# Patient Record
Sex: Female | Born: 1995 | Race: Black or African American | Hispanic: No | Marital: Single | Smoking: Never smoker
Health system: Southern US, Community
[De-identification: ages and names within clinical notes are randomized; demographics above are authoritative.]

## PROBLEM LIST (undated history)

## (undated) DIAGNOSIS — R011 Cardiac murmur, unspecified: Secondary | ICD-10-CM

## (undated) DIAGNOSIS — L309 Dermatitis, unspecified: Secondary | ICD-10-CM

## (undated) HISTORY — PX: MOUTH SURGERY: SHX715

## (undated) HISTORY — PX: KNEE SURGERY: SHX244

## (undated) HISTORY — DX: Dermatitis, unspecified: L30.9

---
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
## 2014-03-23 ENCOUNTER — Encounter (HOSPITAL_COMMUNITY): Payer: Self-pay | Admitting: Emergency Medicine

## 2014-03-23 ENCOUNTER — Emergency Department (HOSPITAL_COMMUNITY): Payer: Federal, State, Local not specified - PPO

## 2014-03-23 ENCOUNTER — Emergency Department (HOSPITAL_COMMUNITY)
Admission: EM | Admit: 2014-03-23 | Discharge: 2014-03-23 | Disposition: A | Discharge status: Home / Self Care | Payer: Federal, State, Local not specified - PPO | Attending: Emergency Medicine | Admitting: Emergency Medicine

## 2014-03-23 DIAGNOSIS — Y9289 Other specified places as the place of occurrence of the external cause: Secondary | ICD-10-CM | POA: Insufficient documentation

## 2014-03-23 DIAGNOSIS — Y9301 Activity, walking, marching and hiking: Secondary | ICD-10-CM | POA: Insufficient documentation

## 2014-03-23 DIAGNOSIS — Z872 Personal history of diseases of the skin and subcutaneous tissue: Secondary | ICD-10-CM | POA: Diagnosis not present

## 2014-03-23 DIAGNOSIS — W010XXA Fall on same level from slipping, tripping and stumbling without subsequent striking against object, initial encounter: Secondary | ICD-10-CM | POA: Insufficient documentation

## 2014-03-23 DIAGNOSIS — S99919A Unspecified injury of unspecified ankle, initial encounter: Secondary | ICD-10-CM

## 2014-03-23 DIAGNOSIS — S90129A Contusion of unspecified lesser toe(s) without damage to nail, initial encounter: Secondary | ICD-10-CM | POA: Insufficient documentation

## 2014-03-23 DIAGNOSIS — R011 Cardiac murmur, unspecified: Secondary | ICD-10-CM | POA: Diagnosis not present

## 2014-03-23 DIAGNOSIS — S8990XA Unspecified injury of unspecified lower leg, initial encounter: Secondary | ICD-10-CM | POA: Diagnosis present

## 2014-03-23 DIAGNOSIS — S99929A Unspecified injury of unspecified foot, initial encounter: Secondary | ICD-10-CM

## 2014-03-23 DIAGNOSIS — S90122A Contusion of left lesser toe(s) without damage to nail, initial encounter: Secondary | ICD-10-CM

## 2014-03-23 NOTE — Discharge Instructions (Signed)
Ibuprofen 600 mg every 6 hours as needed for pain.  Follow up with your primary Dr. if not improving in the next 1-2 weeks.   Contusion A contusion is a deep bruise. Contusions are the result of an injury that caused bleeding under the skin. The contusion may turn blue, purple, or yellow. Minor injuries will give you a painless contusion, but more severe contusions may stay painful and swollen for a few weeks.  CAUSES  A contusion is usually caused by a blow, trauma, or direct force to an area of the body. SYMPTOMS   Swelling and redness of the injured area.  Bruising of the injured area.  Tenderness and soreness of the injured area.  Pain. DIAGNOSIS  The diagnosis can be made by taking a history and physical exam. An X-ray, CT scan, or MRI may be needed to determine if there were any associated injuries, such as fractures. TREATMENT  Specific treatment will depend on what area of the body was injured. In general, the best treatment for a contusion is resting, icing, elevating, and applying cold compresses to the injured area. Over-the-counter medicines may also be recommended for pain control. Ask your caregiver what the best treatment is for your contusion. HOME CARE INSTRUCTIONS   Put ice on the injured area.  Put ice in a plastic bag.  Place a towel between your skin and the bag.  Leave the ice on for 15-20 minutes, 3-4 times a day, or as directed by your health care provider.  Only take over-the-counter or prescription medicines for pain, discomfort, or fever as directed by your caregiver. Your caregiver may recommend avoiding anti-inflammatory medicines (aspirin, ibuprofen, and naproxen) for 48 hours because these medicines may increase bruising.  Rest the injured area.  If possible, elevate the injured area to reduce swelling. SEEK IMMEDIATE MEDICAL CARE IF:   You have increased bruising or swelling.  You have pain that is getting worse.  Your swelling or pain is not  relieved with medicines. MAKE SURE YOU:   Understand these instructions.  Will watch your condition.  Will get help right away if you are not doing well or get worse. Document Released: 03/30/2005 Document Revised: 06/25/2013 Document Reviewed: 04/25/2011 [facility] Patient Information 2015 [place], [place]. This information is not intended to replace advice given to you by your health care provider. Make sure you discuss any questions you have with your health care provider.

## 2014-03-23 NOTE — ED Notes (Signed)
Reports foot went numb, denies pain "unless you mess with it", admits TTP L 5th little toe. Denies other injuries. Was wearing sandals. Also skinned elbow and knee.

## 2014-03-23 NOTE — ED Provider Notes (Signed)
CSN: [id number]     Arrival date & time 03/23/14  0135 History   First MD Initiated Contact with Patient 03/23/14 0530     Chief Complaint  Patient presents with  . Toe Injury     (Consider location/radiation/quality/duration/timing/severity/associated sxs/prior Treatment) HPI Comments: Patient presents with complaints of left fifth toe swelling and pain for the past 2 days. She states that she was walking and tripped and fell and injured it. She's been having difficulty bearing weight and walking due to the discomfort. The pain is relieved with rest.  The history is provided by the patient.    Past Medical History  Diagnosis Date  . Murmur, cardiac   . Eczema    Past Surgical History  Procedure Laterality Date  . Knee surgery    . Knee surgery    . Mouth surgery     Family History  Problem Relation Age of Onset  . Hypertension Father    History  Substance Use Topics  . Smoking status: Never Smoker   . Smokeless tobacco: Not on file  . Alcohol Use: No   OB History   Grav Para Term Preterm Abortions TAB SAB Ect Mult Living                 Review of Systems  All other systems reviewed and are negative.     Allergies  Citric acid  Home Medications   Prior to Admission medications   Medication Sig Start Date End Date Taking? Authorizing Provider  etonogestrel (NEXPLANON) 68 MG IMPL implant Inject 1 each into the skin once.   Yes Historical Provider, MD   BP 117/65  Pulse 102  Temp(Src) 98.1 F (36.7 C) (Oral)  Resp 20  SpO2 98% Physical Exam  Nursing note and vitals reviewed. Constitutional: She is oriented to person, place, and time. She appears well-developed and well-nourished. No distress.  HENT:  Head: Normocephalic and atraumatic.  Neck: Normal range of motion. Neck supple.  Musculoskeletal:  The left fifth toe is noted to have an ecchymotic area on the lateral aspect. There is pain with palpation and range of motion.  Neurological: She is alert  and oriented to person, place, and time.  Skin: Skin is warm and dry. She is not diaphoretic.    ED Course  Procedures (including critical care time) Labs Review Labs Reviewed - No data to display  Imaging Review Dg Toe 5th Left  03/23/2014   CLINICAL DATA:  Left fifth toe pain and swelling. Person fell on patient's foot 2 days ago.  EXAM: DG TOE 5TH LEFT  COMPARISON:  None.  FINDINGS: The left fifth toe appears intact. Visualized joint spaces are preserved. There is no evidence of fracture or dislocation. No definite soft tissue abnormalities are characterized on radiograph.  IMPRESSION: No evidence of fracture or dislocation.   Electronically Signed   By: Roanna Raider M.D.   On: 03/23/2014 02:35     EKG Interpretation None      MDM   Final diagnoses:  None    X-rays failed to reveal a fracture. This appears to be a sprain or stove of the toe. Will recommend Motrin and time. To followup with primary Dr. if not improving in the next 1-2 weeks.    Geoffery Lyons, MD 03/23/14 [phone]

## 2014-03-23 NOTE — ED Notes (Signed)
Pt. reports injury to left 5th toe 2 days ago " twisted" while walking , presents with mild swelling and pain .

## 2014-08-08 ENCOUNTER — Encounter: Payer: Self-pay | Admitting: Obstetrics

## 2014-08-08 ENCOUNTER — Ambulatory Visit (INDEPENDENT_AMBULATORY_CARE_PROVIDER_SITE_OTHER): Admitting: Obstetrics

## 2014-08-08 VITALS — BP 109/65 | HR 77 | Temp 98.5°F | Wt 142.0 lb

## 2014-08-08 DIAGNOSIS — N760 Acute vaginitis: Secondary | ICD-10-CM

## 2014-08-08 DIAGNOSIS — A499 Bacterial infection, unspecified: Secondary | ICD-10-CM

## 2014-08-08 DIAGNOSIS — Z113 Encounter for screening for infections with a predominantly sexual mode of transmission: Secondary | ICD-10-CM

## 2014-08-08 DIAGNOSIS — B9689 Other specified bacterial agents as the cause of diseases classified elsewhere: Secondary | ICD-10-CM

## 2014-08-08 MED ORDER — METRONIDAZOLE 500 MG PO TABS: 500.0000 mg | ORAL_TABLET | Freq: Three times a day (TID) | ORAL | Status: AC

## 2014-08-08 NOTE — Progress Notes (Signed)
Patient ID: Wendy Dorsey, female   DOB: [birth year], 19 y.o.   MRN: [id number]  Chief complaint: change in vaginal discharge, desires STD testing.    HPI Wendy Dorsey is a 19 y.o. female.  Patient complains of a change in her vaginal discharge and itching that went away.  Currently sexually active, not using condoms.    HPI  Past Medical History  Diagnosis Date  . Murmur, cardiac   . Eczema     Past Surgical History  Procedure Laterality Date  . Knee surgery    . Knee surgery    . Mouth surgery      Family History  Problem Relation Age of Onset  . Hypertension Father     Social History History  Substance Use Topics  . Smoking status: Never Smoker   . Smokeless tobacco: Not on file  . Alcohol Use: No    Allergies  Allergen Reactions  . Citric Acid     rash    Current Outpatient Prescriptions  Medication Sig Dispense Refill  . etonogestrel (NEXPLANON) 68 MG IMPL implant Inject 1 each into the skin once.    . metroNIDAZOLE (FLAGYL) 500 MG tablet Take 1 tablet (500 mg total) by mouth 3 (three) times daily. 21 tablet 0   No current facility-administered medications for this visit.    Review of Systems Review of Systems Constitutional: negative for fatigue and weight loss Respiratory: negative for cough and wheezing Cardiovascular: negative for chest pain, fatigue and palpitations Gastrointestinal: negative for abdominal pain and change in bowel habits Genitourinary:negative Integument/breast: negative for nipple discharge Musculoskeletal:negative for myalgias Neurological: negative for gait problems and tremors Behavioral/Psych: negative for abusive relationship, depression Endocrine: negative for temperature intolerance     Blood pressure 109/65, pulse 77, temperature 98.5 F (36.9 C), weight 64.411 kg (142 lb).  Physical Exam Physical Exam General:   alert  Skin:   no rash or abnormalities  Abdomen:  normal findings: no organomegaly,  soft, non-tender and no hernia  Pelvis:  External genitalia: normal general appearance Urinary system: urethral meatus normal and bladder without fullness, nontender Vaginal: normal without tenderness, induration or masses. Small, amount of thin vaginal discharge noted with fishy smell. Cervix: non-tender to palpation. Adnexa: normal bimanual exam Uterus: anteverted and non-tender, normal size     Assessment    Bacterial Vaginosis     Plan    No orders of the defined types were placed in this encounter.   Meds ordered this encounter  Medications  . metroNIDAZOLE (FLAGYL) 500 MG tablet    Sig: Take 1 tablet (500 mg total) by mouth 3 (three) times daily.    Dispense:  21 tablet    Refill:  0    Sure swab obtained for culture.  Encouraged use of condoms with sexual intercourse.    Follow up as needed.

## 2014-08-08 NOTE — Addendum Note (Signed)
Addended by: Marya LandryFOSTER, Meleni Delahunt D on: 08/08/2014 02:26 PM   Modules accepted: Orders

## 2014-08-08 NOTE — Addendum Note (Signed)
Addended by: Marya LandryFOSTER, Davarius Ridener D on: 08/08/2014 02:15 PM   Modules accepted: Orders

## 2014-08-09 LAB — GC/CHLAMYDIA PROBE AMP
CT Probe RNA: NEGATIVE
GC Probe RNA: NEGATIVE

## 2014-08-12 ENCOUNTER — Other Ambulatory Visit: Payer: Self-pay | Admitting: Obstetrics

## 2014-08-12 DIAGNOSIS — B3731 Acute candidiasis of vulva and vagina: Secondary | ICD-10-CM

## 2014-08-12 DIAGNOSIS — B373 Candidiasis of vulva and vagina: Secondary | ICD-10-CM

## 2014-08-12 LAB — SURESWAB BACTERIAL VAGINOSIS/ITIS
ATOPOBIUM VAGINAE: 7.6 Log (cells/mL)
BV CATEGORY: UNDETERMINED — AB
C. ALBICANS, DNA: DETECTED — AB
C. TROPICALIS, DNA: NOT DETECTED
C. glabrata, DNA: NOT DETECTED
C. parapsilosis, DNA: DETECTED — AB
LACTOBACILLUS SPECIES: 8 Log (cells/mL)
MEGASPHAERA SPECIES: 7.8 Log (cells/mL)
T. VAGINALIS RNA, QL TMA: NOT DETECTED

## 2014-08-12 MED ORDER — FLUCONAZOLE 150 MG PO TABS: 150.0000 mg | ORAL_TABLET | Freq: Once | ORAL | Status: DC

## 2014-10-23 ENCOUNTER — Ambulatory Visit: Payer: Self-pay | Admitting: Certified Nurse Midwife

## 2014-10-24 ENCOUNTER — Encounter: Payer: Self-pay | Admitting: Certified Nurse Midwife

## 2014-10-24 ENCOUNTER — Ambulatory Visit (INDEPENDENT_AMBULATORY_CARE_PROVIDER_SITE_OTHER): Payer: Federal, State, Local not specified - PPO | Admitting: Certified Nurse Midwife

## 2014-10-24 VITALS — BP 111/78 | HR 90 | Temp 98.3°F | Ht 59.0 in | Wt 146.0 lb

## 2014-10-24 DIAGNOSIS — Z113 Encounter for screening for infections with a predominantly sexual mode of transmission: Secondary | ICD-10-CM

## 2014-10-24 DIAGNOSIS — L739 Follicular disorder, unspecified: Secondary | ICD-10-CM | POA: Diagnosis not present

## 2014-10-24 LAB — CBC WITH DIFFERENTIAL/PLATELET
BASOS PCT: 0 % (ref 0–1)
Basophils Absolute: 0 10*3/uL (ref 0.0–0.1)
EOS ABS: 0.2 10*3/uL (ref 0.0–0.7)
Eosinophils Relative: 5 % (ref 0–5)
HEMATOCRIT: 39.2 % (ref 36.0–46.0)
HEMOGLOBIN: 13.7 g/dL (ref 12.0–15.0)
Lymphocytes Relative: 49 % — ABNORMAL HIGH (ref 12–46)
Lymphs Abs: 2.2 10*3/uL (ref 0.7–4.0)
MCH: 32.4 pg (ref 26.0–34.0)
MCHC: 34.9 g/dL (ref 30.0–36.0)
MCV: 92.7 fL (ref 78.0–100.0)
MONO ABS: 0.3 10*3/uL (ref 0.1–1.0)
MONOS PCT: 6 % (ref 3–12)
MPV: 9.7 fL (ref 8.6–12.4)
NEUTROS PCT: 40 % — AB (ref 43–77)
Neutro Abs: 1.8 10*3/uL (ref 1.7–7.7)
PLATELETS: 289 10*3/uL (ref 150–400)
RBC: 4.23 MIL/uL (ref 3.87–5.11)
RDW: 13.1 % (ref 11.5–15.5)
WBC: 4.4 10*3/uL (ref 4.0–10.5)

## 2014-10-24 MED ORDER — HYDROCORTISONE 2.5 % EX CREA: TOPICAL_CREAM | Freq: Two times a day (BID) | CUTANEOUS | Status: DC

## 2014-10-24 MED ORDER — TRIPLE ANTIBIOTIC 5-400-5000 EX OINT: TOPICAL_OINTMENT | Freq: Four times a day (QID) | CUTANEOUS | Status: DC

## 2014-10-24 NOTE — Progress Notes (Signed)
Patient ID: Wendy Dorsey, female   DOB: 07-18-1995, 19 y.o.   MRN: [id number]   Chief Complaint  Patient presents with  . Problem    "Lump" in vaginal area.     HPI Wendy Dorsey is a 19 y.o. female.  C/O lump in right groin area, noticed the lump several days ago and reports pain with pressure in the area of the lump.  Denies recent illness.  Vaginal discharge gray denies odor.   Tried douching 1 week ago.  Education on vaginal hygiene given.  Reports shaving pubic hair, shaves every 2 days.  Educated on trimming pubic hair versus shaving.  Patient desires STD screening.     HPI  Past Medical History  Diagnosis Date  . Murmur, cardiac   . Eczema     Past Surgical History  Procedure Laterality Date  . Knee surgery    . Knee surgery    . Mouth surgery      Family History  Problem Relation Age of Onset  . Hypertension Father     Social History History  Substance Use Topics  . Smoking status: Never Smoker   . Smokeless tobacco: Not on file  . Alcohol Use: No    Allergies  Allergen Reactions  . Citric Acid     rash    Current Outpatient Prescriptions  Medication Sig Dispense Refill  . etonogestrel (NEXPLANON) 68 MG IMPL implant Inject 1 each into the skin once.    . hydrocortisone 2.5 % cream Apply topically 2 (two) times daily. 30 g 0  . neomycin-bacitracin-polymyxin (NEOSPORIN) [phone] ointment Apply topically 4 (four) times daily. 28.3 g 0   No current facility-administered medications for this visit.    Review of Systems Review of Systems Constitutional: negative for fatigue and weight loss Respiratory: negative for cough and wheezing Cardiovascular: negative for chest pain, fatigue and palpitations Gastrointestinal: negative for abdominal pain and change in bowel habits Genitourinary:+ lump in groin, groin irritation  Integument/breast: negative for nipple discharge Musculoskeletal:negative for myalgias Neurological: negative  for gait problems and tremors Behavioral/Psych: negative for abusive relationship, depression Endocrine: negative for temperature intolerance     Blood pressure 111/78, pulse 90, temperature 98.3 F (36.8 C), height  (1.499 m), weight 66.225 kg (146 lb).  Physical Exam Physical Exam General:   alert  Skin:   no rash or abnormalities  Lungs:   clear to auscultation bilaterally  Heart:   regular rate and rhythm, S1, S2 normal, no click, rub or gallop. +murmur grade 1-2.  Breasts:   deferred  Abdomen:  normal findings: no organomegaly, soft, non-tender and no hernia  Pelvis:  External genitalia: normal general appearance Urinary system: urethral meatus normal and bladder without fullness, nontender Vaginal: normal without tenderness, induration or masses.  Cervix: deferred Adnexa: deferred Uterus: deferred    75% of 15 min visit spent on counseling and coordination of care.   Data Reviewed Previous medical hx, labs, medicaitons  Assessment     Foliculitis  STD screen    Plan    Orders Placed This Encounter  Procedures  . SureSwab, Vaginosis/Vaginitis Plus  . HIV antibody (with reflex)  . CBC with Differential/Platelet  . Comprehensive metabolic panel  . TSH  . Hepatitis B surface antigen  . RPR  . Hepatitis C antibody   Meds ordered this encounter  Medications  . neomycin-bacitracin-polymyxin (NEOSPORIN) [phone] ointment    Sig: Apply topically 4 (four) times daily.    Dispense:  28.3 g  Refill:  0  . hydrocortisone 2.5 % cream    Sig: Apply topically 2 (two) times daily.    Dispense:  30 g    Refill:  0     Follow up as needed or with annual exam.

## 2014-10-25 LAB — HEPATITIS C ANTIBODY: HCV AB: NEGATIVE

## 2014-10-25 LAB — COMPREHENSIVE METABOLIC PANEL
ALBUMIN: 3.8 g/dL (ref 3.5–5.2)
ALK PHOS: 51 U/L (ref 39–117)
ALT: 21 U/L (ref 0–35)
AST: 19 U/L (ref 0–37)
BILIRUBIN TOTAL: 0.4 mg/dL (ref 0.2–1.1)
BUN: 10 mg/dL (ref 6–23)
CALCIUM: 8.7 mg/dL (ref 8.4–10.5)
CO2: 23 mEq/L (ref 19–32)
Chloride: 106 mEq/L (ref 96–112)
Creat: 0.73 mg/dL (ref 0.50–1.10)
Glucose, Bld: 77 mg/dL (ref 70–99)
POTASSIUM: 3.7 meq/L (ref 3.5–5.3)
SODIUM: 141 meq/L (ref 135–145)
TOTAL PROTEIN: 6 g/dL (ref 6.0–8.3)

## 2014-10-25 LAB — HIV ANTIBODY (ROUTINE TESTING W REFLEX): HIV: NONREACTIVE

## 2014-10-25 LAB — TSH: TSH: 1.373 u[IU]/mL (ref 0.350–4.500)

## 2014-10-25 LAB — RPR

## 2014-10-25 LAB — HEPATITIS B SURFACE ANTIGEN: Hepatitis B Surface Ag: NEGATIVE

## 2014-10-28 ENCOUNTER — Other Ambulatory Visit: Payer: Self-pay | Admitting: Certified Nurse Midwife

## 2014-10-28 DIAGNOSIS — B9689 Other specified bacterial agents as the cause of diseases classified elsewhere: Secondary | ICD-10-CM

## 2014-10-28 DIAGNOSIS — N760 Acute vaginitis: Principal | ICD-10-CM

## 2014-10-28 LAB — SURESWAB, VAGINOSIS/VAGINITIS PLUS
Atopobium vaginae: 5.4 Log (cells/mL)
C. ALBICANS, DNA: NOT DETECTED
C. GLABRATA, DNA: NOT DETECTED
C. PARAPSILOSIS, DNA: NOT DETECTED
C. TRACHOMATIS RNA, TMA: NOT DETECTED
C. TROPICALIS, DNA: NOT DETECTED
LACTOBACILLUS SPECIES: NOT DETECTED Log (cells/mL)
MEGASPHAERA SPECIES: 7.8 Log (cells/mL)
N. GONORRHOEAE RNA, TMA: NOT DETECTED
T. vaginalis RNA, QL TMA: NOT DETECTED

## 2014-10-28 MED ORDER — TINIDAZOLE 500 MG PO TABS: 2.0000 g | ORAL_TABLET | Freq: Every day | ORAL | Status: DC

## 2014-10-29 ENCOUNTER — Other Ambulatory Visit: Payer: Self-pay | Admitting: Certified Nurse Midwife

## 2014-10-29 ENCOUNTER — Other Ambulatory Visit: Payer: Self-pay | Admitting: *Deleted

## 2014-10-29 DIAGNOSIS — B9689 Other specified bacterial agents as the cause of diseases classified elsewhere: Secondary | ICD-10-CM

## 2014-10-29 DIAGNOSIS — N760 Acute vaginitis: Principal | ICD-10-CM

## 2014-10-29 MED ORDER — TINIDAZOLE 500 MG PO TABS: 2.0000 g | ORAL_TABLET | Freq: Every day | ORAL | Status: DC

## 2014-10-29 NOTE — Progress Notes (Signed)
Rx for Tinidazole was reordered due to pharmacy not having R.Denney on file as provider.  Rx was reordered under Dr Clearance CootsHarper.

## 2014-11-03 ENCOUNTER — Other Ambulatory Visit: Payer: Self-pay | Admitting: *Deleted

## 2014-11-03 DIAGNOSIS — B379 Candidiasis, unspecified: Secondary | ICD-10-CM

## 2014-11-03 DIAGNOSIS — N390 Urinary tract infection, site not specified: Secondary | ICD-10-CM

## 2014-11-03 DIAGNOSIS — T3695XA Adverse effect of unspecified systemic antibiotic, initial encounter: Secondary | ICD-10-CM

## 2014-11-03 MED ORDER — FLUCONAZOLE 150 MG PO TABS: 150.0000 mg | ORAL_TABLET | Freq: Every day | ORAL | Status: DC

## 2014-11-03 MED ORDER — NITROFURANTOIN MONOHYD MACRO 100 MG PO CAPS: 100.0000 mg | ORAL_CAPSULE | Freq: Two times a day (BID) | ORAL | Status: DC

## 2014-11-03 NOTE — Progress Notes (Signed)
Patient contacted the office stating she is having burning with urination, frequent urination and the urge to go with not much coming out. Patient also given sureswab results. Per nursing protocol Macrobid sent to the pharmacy. Patient was also given Rx for Diflucan.

## 2014-11-04 ENCOUNTER — Telehealth: Payer: Self-pay | Admitting: *Deleted

## 2014-11-04 ENCOUNTER — Other Ambulatory Visit: Payer: Self-pay | Admitting: *Deleted

## 2014-11-04 DIAGNOSIS — N760 Acute vaginitis: Principal | ICD-10-CM

## 2014-11-04 DIAGNOSIS — B9689 Other specified bacterial agents as the cause of diseases classified elsewhere: Secondary | ICD-10-CM

## 2014-11-04 MED ORDER — CLINDAMYCIN PHOSPHATE 2 % VA CREA: 1.0000 | TOPICAL_CREAM | Freq: Every day | VAGINAL | Status: DC

## 2014-11-04 NOTE — Telephone Encounter (Signed)
Patient states she was given an antibiotic for a bacterial infection. Her instructions were to take 4 tablets at breakfast which she did. Soon after her hands and feet started swelling and itching then she began to beakout in a rash/hives. She used her Epipen and she got better. She wants to know if maybe she took too much medication. Told patient she had taken her medication as directed and she did the correct thing using her Epipen. She is better now. Told patient to dispose of the rest of the medication and it would be recorded as an allergy in her chart. Will check with her provider for alternative treatment.

## 2014-11-04 NOTE — Telephone Encounter (Signed)
Patient notified of alternative treatment. Rx sent to pharmacy.

## 2014-11-04 NOTE — Telephone Encounter (Signed)
Hi please give her clindamycin cream vaginally.  1 applicator at HS for 3 days.  Thank you.

## 2015-01-19 ENCOUNTER — Ambulatory Visit (INDEPENDENT_AMBULATORY_CARE_PROVIDER_SITE_OTHER): Payer: Federal, State, Local not specified - PPO | Admitting: Obstetrics

## 2015-01-19 ENCOUNTER — Encounter: Payer: Self-pay | Admitting: Obstetrics

## 2015-01-19 VITALS — BP 104/67 | HR 78 | Temp 98.8°F | Ht 59.0 in | Wt 142.0 lb

## 2015-01-19 DIAGNOSIS — N898 Other specified noninflammatory disorders of vagina: Secondary | ICD-10-CM | POA: Diagnosis not present

## 2015-01-20 ENCOUNTER — Encounter: Payer: Self-pay | Admitting: Obstetrics

## 2015-01-20 NOTE — Addendum Note (Signed)
Addended by: Jamine Highfill L on: 01/20/2015 10:40 AM   Modules accepted: Orders  

## 2015-01-20 NOTE — Progress Notes (Signed)
Patient ID: Wendy Dorsey RecordsDanielle Dorsey, female   DOB: 1995-08-16, 19 y.o.   MRN: [id number]  Chief Complaint  Patient presents with  . STD Check    HPI Wendy Dorsey RecordsDanielle Coach is a 19 y.o. female.  Malodorous vaginal discharge.  Denies vaginal itching.  HPI  Past Medical History  Diagnosis Date  . Murmur, cardiac   . Eczema     Past Surgical History  Procedure Laterality Date  . Knee surgery    . Knee surgery    . Mouth surgery      Family History  Problem Relation Age of Onset  . Hypertension Father     Social History History  Substance Use Topics  . Smoking status: Never Smoker   . Smokeless tobacco: Not on file  . Alcohol Use: No    Allergies  Allergen Reactions  . Tindamax [Tinidazole] Itching, Swelling and Rash    Patient had to use her Epipen   . Citric Acid     rash    Current Outpatient Prescriptions  Medication Sig Dispense Refill  . etonogestrel (NEXPLANON) 68 MG IMPL implant Inject 1 each into the skin once.     No current facility-administered medications for this visit.    Review of Systems Review of Systems Constitutional: negative for fatigue and weight loss Respiratory: negative for cough and wheezing Cardiovascular: negative for chest pain, fatigue and palpitations Gastrointestinal: negative for abdominal pain and change in bowel habits Genitourinary: vaginal discharge Integument/breast: negative for nipple discharge Musculoskeletal:negative for myalgias Neurological: negative for gait problems and tremors Behavioral/Psych: negative for abusive relationship, depression Endocrine: negative for temperature intolerance     Blood pressure 104/67, pulse 78, temperature 98.8 F (37.1 C), height 4\' 11"  (1.499 m), weight 142 lb (64.411 kg), last menstrual period 01/14/2015.  Physical Exam  Physical Exam : Deferred                                                                                                                                                                                                                                                  Data Reviewed Labs  Assessment     Vaginal discharge     Plan    Self swab wet prep sent. F/U prn   No orders of the defined types were placed in this encounter.   No orders of the  defined types were placed in this encounter.

## 2015-01-21 ENCOUNTER — Telehealth: Payer: Self-pay | Admitting: *Deleted

## 2015-01-21 NOTE — Telephone Encounter (Signed)
Patient called for lab results. Patient advised her lab results were not back yet. Patient verbalized understanding.

## 2015-01-23 ENCOUNTER — Other Ambulatory Visit: Payer: Self-pay | Admitting: Obstetrics

## 2015-01-23 DIAGNOSIS — B9689 Other specified bacterial agents as the cause of diseases classified elsewhere: Secondary | ICD-10-CM

## 2015-01-23 DIAGNOSIS — B373 Candidiasis of vulva and vagina: Secondary | ICD-10-CM

## 2015-01-23 DIAGNOSIS — B3731 Acute candidiasis of vulva and vagina: Secondary | ICD-10-CM

## 2015-01-23 DIAGNOSIS — N760 Acute vaginitis: Principal | ICD-10-CM

## 2015-01-23 LAB — SURESWAB, VAGINOSIS/VAGINITIS PLUS
Atopobium vaginae: NOT DETECTED Log (cells/mL)
BV CATEGORY: UNDETERMINED — AB
C. albicans, DNA: DETECTED — AB
C. glabrata, DNA: NOT DETECTED
C. parapsilosis, DNA: DETECTED — AB
C. trachomatis RNA, TMA: NOT DETECTED
C. tropicalis, DNA: NOT DETECTED
Gardnerella vaginalis: >8 Log (cells/mL)
LACTOBACILLUS SPECIES: 4.7 Log (cells/mL)
MEGASPHAERA SPECIES: NOT DETECTED Log (cells/mL)
N. gonorrhoeae RNA, TMA: NOT DETECTED
T. vaginalis RNA, QL TMA: NOT DETECTED

## 2015-01-23 MED ORDER — METRONIDAZOLE 500 MG PO TABS: 500.0000 mg | ORAL_TABLET | Freq: Two times a day (BID) | ORAL | Status: DC

## 2015-01-23 MED ORDER — FLUCONAZOLE 150 MG PO TABS: 150.0000 mg | ORAL_TABLET | Freq: Once | ORAL | Status: DC

## 2015-06-01 ENCOUNTER — Encounter: Payer: Self-pay | Admitting: Certified Nurse Midwife

## 2015-06-01 ENCOUNTER — Ambulatory Visit (INDEPENDENT_AMBULATORY_CARE_PROVIDER_SITE_OTHER): Payer: Federal, State, Local not specified - PPO | Admitting: Certified Nurse Midwife

## 2015-06-01 VITALS — BP 108/73 | HR 88 | Temp 98.3°F | Ht 59.0 in | Wt 142.0 lb

## 2015-06-01 DIAGNOSIS — Z113 Encounter for screening for infections with a predominantly sexual mode of transmission: Secondary | ICD-10-CM

## 2015-06-01 DIAGNOSIS — Z30011 Encounter for initial prescription of contraceptive pills: Secondary | ICD-10-CM | POA: Diagnosis not present

## 2015-06-01 MED ORDER — LEVONORGEST-ETH ESTRAD 91-DAY 0.15-0.03 MG PO TABS: 1.0000 | ORAL_TABLET | Freq: Every day | ORAL | Status: DC

## 2015-06-02 ENCOUNTER — Ambulatory Visit: Payer: Federal, State, Local not specified - PPO | Admitting: Certified Nurse Midwife

## 2015-06-02 LAB — HEPATITIS C ANTIBODY: HCV Ab: NEGATIVE

## 2015-06-02 LAB — RPR

## 2015-06-02 LAB — HIV ANTIBODY (ROUTINE TESTING W REFLEX): HIV: NONREACTIVE

## 2015-06-02 LAB — HEPATITIS B SURFACE ANTIGEN: Hepatitis B Surface Ag: NEGATIVE

## 2015-06-02 NOTE — Progress Notes (Signed)
Patient ID: Wendy Dorsey, female   DOB: 1995/10/26, 19 y.o.   MRN: [id number]   Chief Complaint  Patient presents with  . Vaginal Bleeding    Bleeding for 3 months. Moderate to heavy bleeding. Passing blood clots.     HPI Wendy Dorsey is a 19 y.o. female.  Here for f/u on bleeding with Nexplanon.  States that she bleeds for 3 months with spotting and then has a several week break and starts bleeding again.  Desires to have the Nexplanon removed and another type of device inserted.  She is currently sexually active.  States that she is not good with taking pills on a daily basis.  Declines Nuva Ring, ortho evra patches, or condoms.  States that she would like to try the new IUD Palau.  Patient informed that would be after the new year.  Patient desires to have another 3 month pill pack until the other device can be inserted.   Desires full STD screening exam.  HPI  Past Medical History  Diagnosis Date  . Murmur, cardiac   . Eczema     Past Surgical History  Procedure Laterality Date  . Knee surgery    . Knee surgery    . Mouth surgery      Family History  Problem Relation Age of Onset  . Hypertension Father     Social History Social History  Substance Use Topics  . Smoking status: Never Smoker   . Smokeless tobacco: None  . Alcohol Use: No    Allergies  Allergen Reactions  . Tindamax [Tinidazole] Itching, Swelling and Rash    Patient had to use her Epipen   . Citric Acid     rash    Current Outpatient Prescriptions  Medication Sig Dispense Refill  . etonogestrel (NEXPLANON) 68 MG IMPL implant Inject 1 each into the skin once.    Marland Kitchen levonorgestrel-ethinyl estradiol (SEASONALE,INTROVALE,JOLESSA) 0.15-0.03 MG tablet Take 1 tablet by mouth daily. 1 Package 4   No current facility-administered medications for this visit.    Review of Systems Review of Systems Constitutional: negative for fatigue and weight loss Respiratory: negative for cough  and wheezing Cardiovascular: negative for chest pain, fatigue and palpitations Gastrointestinal: negative for abdominal pain and change in bowel habits Genitourinary:negative Integument/breast: negative for nipple discharge Musculoskeletal:negative for myalgias Neurological: negative for gait problems and tremors Behavioral/Psych: negative for abusive relationship, depression Endocrine: negative for temperature intolerance     Blood pressure 108/73, pulse 88, temperature 98.3 F (36.8 C), height  (1.499 m), weight 142 lb (64.411 kg).  Physical Exam Physical Exam General:   alert  Skin:   no rash or abnormalities  Lungs:   clear to auscultation bilaterally  Heart:   regular rate and rhythm, S1, S2 normal, no murmur, click, rub or gallop  Breasts:   normal without suspicious masses, skin or nipple changes or axillary nodes  Abdomen:  normal findings: no organomegaly, soft, non-tender and no hernia  Pelvis:  External genitalia: normal general appearance Urinary system: urethral meatus normal and bladder without fullness, nontender Vaginal: normal without tenderness, induration or masses Cervix: normal appearance Adnexa: normal bimanual exam Uterus: anteverted and non-tender, normal size    50% of 15 min visit spent on counseling and coordination of care.   Data Reviewed Previous medical hx, labs, meds  Assessment     AUB with Nexplanon Contraception counseling STD screening     Plan    Orders Placed This Encounter  Procedures  .  SureSwab, Vaginosis/Vaginitis Plus  . HIV antibody (with reflex)  . Hepatitis B surface antigen  . RPR  . Hepatitis C antibody   Meds ordered this encounter  Medications  . levonorgestrel-ethinyl estradiol (SEASONALE,INTROVALE,JOLESSA) 0.15-0.03 MG tablet    Sig: Take 1 tablet by mouth daily.    Dispense:  1 Package    Refill:  4     Follow up with Nexplanon removal and Kyleena insertion when the device is Programme researcher, broadcasting/film/videoavaliable

## 2015-06-04 ENCOUNTER — Other Ambulatory Visit: Payer: Self-pay | Admitting: Certified Nurse Midwife

## 2015-06-04 DIAGNOSIS — N760 Acute vaginitis: Secondary | ICD-10-CM

## 2015-06-04 DIAGNOSIS — B9689 Other specified bacterial agents as the cause of diseases classified elsewhere: Secondary | ICD-10-CM

## 2015-06-04 DIAGNOSIS — A749 Chlamydial infection, unspecified: Secondary | ICD-10-CM

## 2015-06-04 LAB — SURESWAB, VAGINOSIS/VAGINITIS PLUS
Atopobium vaginae: NOT DETECTED Log (cells/mL)
C. ALBICANS, DNA: NOT DETECTED
C. PARAPSILOSIS, DNA: NOT DETECTED
C. TROPICALIS, DNA: NOT DETECTED
C. glabrata, DNA: NOT DETECTED
C. trachomatis RNA, TMA: DETECTED — AB
Gardnerella vaginalis: 6.8 Log (cells/mL)
LACTOBACILLUS SPECIES: NOT DETECTED Log (cells/mL)
MEGASPHAERA SPECIES: NOT DETECTED Log (cells/mL)
N. gonorrhoeae RNA, TMA: NOT DETECTED
T. VAGINALIS RNA, QL TMA: NOT DETECTED

## 2015-06-04 MED ORDER — FLUCONAZOLE 100 MG PO TABS: 100.0000 mg | ORAL_TABLET | Freq: Once | ORAL | Status: DC

## 2015-06-04 MED ORDER — TERCONAZOLE 0.4 % VA CREA: 1.0000 | TOPICAL_CREAM | Freq: Every day | VAGINAL | Status: DC

## 2015-06-04 MED ORDER — CLINDAMYCIN HCL 300 MG PO CAPS: 300.0000 mg | ORAL_CAPSULE | Freq: Two times a day (BID) | ORAL | Status: DC

## 2015-06-04 MED ORDER — AZITHROMYCIN 250 MG PO TABS: ORAL_TABLET | ORAL | Status: DC

## 2015-06-11 ENCOUNTER — Telehealth: Payer: Self-pay | Admitting: *Deleted

## 2015-06-11 NOTE — Telephone Encounter (Signed)
Patient is interested in the newer IUD the PalauKyleena. It should be available in office in January 2017. Patient has elected to continue with her Nexplanon until the IUD is in office. Patient has tentatively been scheduled for July 14, 2015 for a Nexplanon removal.

## 2015-06-16 ENCOUNTER — Telehealth: Payer: Self-pay | Admitting: *Deleted

## 2015-06-16 DIAGNOSIS — A749 Chlamydial infection, unspecified: Secondary | ICD-10-CM

## 2015-06-16 MED ORDER — AZITHROMYCIN 250 MG PO TABS: ORAL_TABLET | ORAL | Status: DC

## 2015-06-16 NOTE — Telephone Encounter (Signed)
Patient states she dropped her medication on the ground and it was raining and she did not want to pick it up off the ground to take it. Patient requesting a refill of the antibiotic for [facility] infection [facility] [place]. Rx refill sent to pharmacy.

## 2015-07-05 HISTORY — PX: LIPOSUCTION: SHX10

## 2015-07-14 ENCOUNTER — Ambulatory Visit: Payer: Self-pay | Admitting: Certified Nurse Midwife

## 2015-07-21 ENCOUNTER — Telehealth: Payer: Self-pay | Admitting: *Deleted

## 2015-07-21 ENCOUNTER — Ambulatory Visit (INDEPENDENT_AMBULATORY_CARE_PROVIDER_SITE_OTHER): Payer: Federal, State, Local not specified - PPO | Admitting: Allergy and Immunology

## 2015-07-21 ENCOUNTER — Encounter: Payer: Self-pay | Admitting: Allergy and Immunology

## 2015-07-21 VITALS — BP 90/78 | HR 72 | Resp 12

## 2015-07-21 DIAGNOSIS — J309 Allergic rhinitis, unspecified: Secondary | ICD-10-CM | POA: Diagnosis not present

## 2015-07-21 DIAGNOSIS — H1010 Acute atopic conjunctivitis, unspecified eye: Secondary | ICD-10-CM | POA: Diagnosis not present

## 2015-07-21 DIAGNOSIS — J4520 Mild intermittent asthma, uncomplicated: Secondary | ICD-10-CM

## 2015-07-21 DIAGNOSIS — Z91018 Allergy to other foods: Secondary | ICD-10-CM | POA: Diagnosis not present

## 2015-07-21 DIAGNOSIS — L209 Atopic dermatitis, unspecified: Secondary | ICD-10-CM

## 2015-07-21 DIAGNOSIS — T781XXD Other adverse food reactions, not elsewhere classified, subsequent encounter: Secondary | ICD-10-CM | POA: Diagnosis not present

## 2015-07-21 MED ORDER — BUDESONIDE 180 MCG/ACT IN AEPB: 2.0000 | INHALATION_SPRAY | Freq: Every day | RESPIRATORY_TRACT | Status: DC

## 2015-07-21 MED ORDER — MOMETASONE FUROATE 110 MCG/INH IN AEPB: 1.0000 | INHALATION_SPRAY | Freq: Every day | RESPIRATORY_TRACT | Status: DC

## 2015-07-21 MED ORDER — OLOPATADINE HCL 0.7 % OP SOLN: 1.0000 [drp] | OPHTHALMIC | Status: DC

## 2015-07-21 MED ORDER — EPINEPHRINE 0.3 MG/0.3ML IJ SOAJ: 0.3000 mg | Freq: Once | INTRAMUSCULAR | Status: AC

## 2015-07-21 MED ORDER — ALBUTEROL SULFATE 108 (90 BASE) MCG/ACT IN AEPB: 2.0000 | INHALATION_SPRAY | RESPIRATORY_TRACT | Status: DC | PRN

## 2015-07-21 MED ORDER — MONTELUKAST SODIUM 10 MG PO TABS: 10.0000 mg | ORAL_TABLET | Freq: Every day | ORAL | Status: DC

## 2015-07-21 MED ORDER — MOMETASONE FUROATE 0.1 % EX CREA: 1.0000 "application " | TOPICAL_CREAM | Freq: Every day | CUTANEOUS | Status: DC

## 2015-07-21 NOTE — Telephone Encounter (Signed)
Patient contacted the office in regards to an appointment for her birth control.  Attempted to contact the patient and left message foe patient to call the office.

## 2015-07-21 NOTE — Patient Instructions (Addendum)
  1. Treat and prevent inflammation:   A. OTC Rhinocort one spray each nostril one time per day  B. montelukast 10 mg one tablet one time per day  C. Asmanex 220 1 inhalations one time per day  2. If needed:   A. ProAir air respiclick 2 inhalations every 4-6 hours if needed  B. cetirizine 10 mg one tablet one time per day  C. Pazeo one drop each eye one time per day  D. EpiPen, Benadryl, M.D./ER for allergic reaction  E. Mometasone 0.1% cream apply to eczema one time a day  3. Restart immunotherapy  4. Return to clinic in 4 weeks or earlier if problem

## 2015-07-21 NOTE — Progress Notes (Signed)
[facility]  Follow-up Note  Referring Provider: No ref. provider found Primary Provider: No PCP Per Patient Date of Office Visit: 07/21/2015  Subjective:   Wendy Dorsey is a 20 y.o. female who [facility] in re-evaluation of the following:  HPI Comments: [name] presents this clinic on 07/21/2015 in reevaluation of her allergic rhinoconjunctivitis. When I last saw her in his clinic almost 2 years ago for her initial evaluation she demonstrated severe hypersensitivity against multiple aeroallergens and had a history of not just allergic rhinoconjunctivitis but food allergy and oral pollinosis syndrome. In addition, she's recently developed problems with wheezing and coughing. Most of her issues appear to resolve around exposure to pollens and to animals. She is at Scientist, clinical (histocompatibility and immunogenetics) at a [employer] and when she gets exposed to animal she has tremendous problems with her skin and her nose and her eyes and occasionally wheezing and coughing. She did start a course of immunotherapy back in May of 2015 but she discontinued this immunotherapy soon after because of logistical issue. She would like to go back on immunotherapy.   Current Outpatient Prescriptions on File Prior to Visit  Medication Sig Dispense Refill  . etonogestrel (NEXPLANON) 68 MG IMPL implant Inject 1 each into the skin once.    Marland Kitchen azithromycin (ZITHROMAX) 250 MG tablet Take 4 tablets all together now. (Patient not taking: Reported on 07/21/2015) 4 tablet 0  . clindamycin (CLEOCIN) 300 MG capsule Take 1 capsule (300 mg total) by mouth 2 (two) times daily. (Patient not taking: Reported on 07/21/2015) 14 capsule 0  . levonorgestrel-ethinyl estradiol (SEASONALE,INTROVALE,JOLESSA) 0.15-0.03 MG tablet Take 1 tablet by mouth daily. (Patient not taking: Reported on 07/21/2015) 1 Package 4  . terconazole (TERAZOL 7) 0.4 % vaginal cream Place 1  applicator vaginally at bedtime. (Patient not taking: Reported on 07/21/2015) 45 g 0   No current facility-administered medications on file prior to visit.    Meds ordered this encounter  Medications  . Olopatadine HCl (PAZEO) 0.7 % SOLN    Sig: Place 1 drop into both eyes 1 day or 1 dose.    Dispense:  1 Bottle    Refill:  5  . mometasone (ELOCON) 0.1 % cream    Sig: Apply 1 application topically daily.    Dispense:  45 g    Refill:  0  . Albuterol Sulfate (PROAIR RESPICLICK) 108 (90 Base) MCG/ACT AEPB    Sig: Inhale 2 Doses into the lungs every 4 (four) hours as needed.    Dispense:  1 each    Refill:  1  . EPINEPHrine (EPIPEN 2-PAK) 0.3 mg/0.3 mL IJ SOAJ injection    Sig: Inject 0.3 mLs (0.3 mg total) into the muscle once.    Dispense:  4 Device    Refill:  2  . DISCONTD: budesonide (PULMICORT FLEXHALER) 180 MCG/ACT inhaler    Sig: Inhale 2 puffs into the lungs daily. TO PREVENT COUGH OR WHEEZE    Dispense:  1 Inhaler    Refill:  5  . montelukast (SINGULAIR) 10 MG tablet    Sig: Take 1 tablet (10 mg total) by mouth at bedtime.    Dispense:  30 tablet    Refill:  5  . Mometasone Furoate (ASMANEX 30 METERED DOSES) 110 MCG/INH AEPB    Sig: Inhale 1 Dose into the lungs daily.    Dispense:  1 Inhaler    Refill:  5  Past Medical History  Diagnosis Date  . Murmur, cardiac   . Eczema     Past Surgical History  Procedure Laterality Date  . Knee surgery    . Knee surgery    . Mouth surgery      Allergies  Allergen Reactions  . Tindamax [Tinidazole] Itching, Swelling and Rash    Patient had to use her Epipen   . Citric Acid     rash    Review of systems negative except as noted in HPI / PMHx or noted below:  ROS   Objective:   Filed Vitals:   07/21/15 1520  BP: 90/78  Pulse: 72  Resp: 12          Physical Exam  Diagnostics:    Spirometry was performed and demonstrated an FEV1 of 2.51 at 105 % of predicted.  The patient had an Asthma Control  Test with the following results:  .    Assessment and Plan:   1. Asthma, mild intermittent, well-controlled   2. Allergic rhinoconjunctivitis   3. Food allergy   4. Oral allergy syndrome, subsequent encounter   5. Atopic dermatitis     1. Treat and prevent inflammation:   A. OTC Rhinocort one spray each nostril one time per day  B. montelukast 10 mg one tablet one time per day  C. Asmanex 220 1 inhalations one time per day  2. If needed:   A. ProAir air respiclick 2 inhalations every 4-6 hours if needed  B. cetirizine 10 mg one tablet one time per day  C. Pazeo one drop each eye one time per day  D. EpiPen, Benadryl, M.D./ER for allergic reaction  E. Mometasone 0.1% cream apply to eczema one time a day  3. Restart immunotherapy  4. Return to clinic in 4 weeks or earlier if problem   Troi will use preventative therapy as mentioned above and restart a course immunotherapy as a long-term approach to her multiorgan atopic disease. I will regroup with her in a possibly 4 weeks to make sure that she's doing well on her plan. Further treatment will be based upon her response.  Laurette Schimke, MD [facility]

## 2015-07-23 DIAGNOSIS — J301 Allergic rhinitis due to pollen: Secondary | ICD-10-CM | POA: Diagnosis not present

## 2015-07-24 DIAGNOSIS — J3089 Other allergic rhinitis: Secondary | ICD-10-CM | POA: Diagnosis not present

## 2015-07-28 ENCOUNTER — Ambulatory Visit (INDEPENDENT_AMBULATORY_CARE_PROVIDER_SITE_OTHER): Payer: Federal, State, Local not specified - PPO | Admitting: Certified Nurse Midwife

## 2015-07-28 ENCOUNTER — Encounter: Payer: Self-pay | Admitting: Certified Nurse Midwife

## 2015-07-28 VITALS — BP 98/64 | HR 82 | Temp 98.1°F | Wt 147.0 lb

## 2015-07-28 DIAGNOSIS — Z30014 Encounter for initial prescription of intrauterine contraceptive device: Secondary | ICD-10-CM

## 2015-07-28 DIAGNOSIS — Z30430 Encounter for insertion of intrauterine contraceptive device: Secondary | ICD-10-CM | POA: Diagnosis not present

## 2015-07-28 DIAGNOSIS — Z3046 Encounter for surveillance of implantable subdermal contraceptive: Secondary | ICD-10-CM

## 2015-07-28 NOTE — Progress Notes (Signed)
Patient ID: Wendy Dorsey Records, female   DOB: Oct 01, 1995, 19 y.o.   MRN: [id number]  Procedure Note Removal of Nexplanon  Patient had Nexplanon inserted in January 2014. Desires removal today.   Reviewed risk and benefits of procedure. Alternative options discussed Patient reported understanding and agreed to continue.   The patient's right arm was palpated and the implant device located. The area was prepped with Betadinex3. The distal end of the device was palpated and 1 cc of 1% lidocaine without epinephrine was injected. A 2 mm incision was made. Any fibrotic tissue was carefully dissected away using blunt and/or sharp dissection. Over the tip and the tip was exposed, grasped with forcep and removed intact. Steri-strips and a sterile dressing were applied to the incision.   And a bandage applied and the arm was wrapped with gauze bandage.  The patient tolerated well.  Instructions:  The patient was instructed to remove the dressing in 24 hours and that some bruising is to be expected.  She was advised to use over the counter analgesics as needed for any pain at the site.  She is to keep the area dry for 24 hours and to call if her hand or arm becomes cold, numb, or blue.  Return visit:  Return in 4 weeks Patient plans IUD  Orvilla Cornwall CNM IUD Procedure Note   DIAGNOSIS: Desires long-term, reversible contraception   PROCEDURE: IUD placement Performing Provider: Orvilla Cornwall CNM  Patient counseled prior to procedure. I explained risks and benefits of Kyleena IUD, reviewed alternative forms of contraception. Patient stated understanding and consented to continue with procedure.   LMP: 07/20/2015 Pregnancy Test: Negative Lot #: [id number] Expiration Date: March 2018   IUD type: [   ] Mirena   [   ] Paraguard  [   ] Skyla     Kyleena  PROCEDURE:  Timeout procedure was performed to ensure right patient and right site.  A bimanual exam was performed to determine the position  of the uterus, retroverted. The speculum was placed. The vagina and cervix was sterilized in the usual manner and sterile technique was maintained throughout the course of the procedure. A single toothed tenaculum was not required. The depth of the uterus was sounded to 7 cm. With gentle traction on the tenaculum, the IUD was inserted to the appropriate depth and inserted without difficulty.  The string was cut to an estimated 4 cm length. Bleeding was minimal. The patient tolerated the procedure well.   Follow up: The patient tolerated the procedure well without complications.  Standard post-procedure care is explained and return precautions are given.  Orvilla Cornwall CNM

## 2015-07-29 NOTE — Telephone Encounter (Signed)
Patient was seen on 07-28-15 for Nexplanon Removal and Kyleena placement.

## 2015-07-31 ENCOUNTER — Ambulatory Visit (INDEPENDENT_AMBULATORY_CARE_PROVIDER_SITE_OTHER): Payer: Federal, State, Local not specified - PPO | Admitting: Certified Nurse Midwife

## 2015-07-31 ENCOUNTER — Encounter: Payer: Self-pay | Admitting: Certified Nurse Midwife

## 2015-07-31 VITALS — BP 114/71 | HR 91 | Temp 98.2°F | Wt 147.0 lb

## 2015-07-31 DIAGNOSIS — Z975 Presence of (intrauterine) contraceptive device: Secondary | ICD-10-CM

## 2015-07-31 DIAGNOSIS — N898 Other specified noninflammatory disorders of vagina: Secondary | ICD-10-CM | POA: Diagnosis not present

## 2015-07-31 DIAGNOSIS — L739 Follicular disorder, unspecified: Secondary | ICD-10-CM | POA: Diagnosis not present

## 2015-07-31 MED ORDER — MUPIROCIN 2 % EX OINT: 1.0000 "application " | TOPICAL_OINTMENT | Freq: Two times a day (BID) | CUTANEOUS | Status: DC | PRN

## 2015-07-31 MED ORDER — HYDROCORTISONE 2.5 % EX CREA: TOPICAL_CREAM | Freq: Two times a day (BID) | CUTANEOUS | Status: DC | PRN

## 2015-08-02 ENCOUNTER — Encounter (HOSPITAL_COMMUNITY): Payer: Self-pay | Admitting: Emergency Medicine

## 2015-08-02 ENCOUNTER — Emergency Department (HOSPITAL_COMMUNITY)
Admission: EM | Admit: 2015-08-02 | Discharge: 2015-08-02 | Disposition: A | Discharge status: Home / Self Care | Payer: Federal, State, Local not specified - PPO | Attending: Emergency Medicine | Admitting: Emergency Medicine

## 2015-08-02 ENCOUNTER — Emergency Department (HOSPITAL_COMMUNITY): Payer: Federal, State, Local not specified - PPO

## 2015-08-02 DIAGNOSIS — Y9289 Other specified places as the place of occurrence of the external cause: Secondary | ICD-10-CM | POA: Diagnosis not present

## 2015-08-02 DIAGNOSIS — R011 Cardiac murmur, unspecified: Secondary | ICD-10-CM | POA: Insufficient documentation

## 2015-08-02 DIAGNOSIS — Z975 Presence of (intrauterine) contraceptive device: Secondary | ICD-10-CM | POA: Insufficient documentation

## 2015-08-02 DIAGNOSIS — Y9389 Activity, other specified: Secondary | ICD-10-CM | POA: Diagnosis not present

## 2015-08-02 DIAGNOSIS — Y998 Other external cause status: Secondary | ICD-10-CM | POA: Diagnosis not present

## 2015-08-02 DIAGNOSIS — Z79899 Other long term (current) drug therapy: Secondary | ICD-10-CM | POA: Insufficient documentation

## 2015-08-02 DIAGNOSIS — M25562 Pain in left knee: Secondary | ICD-10-CM

## 2015-08-02 DIAGNOSIS — Z872 Personal history of diseases of the skin and subcutaneous tissue: Secondary | ICD-10-CM | POA: Diagnosis not present

## 2015-08-02 DIAGNOSIS — W500XXA Accidental hit or strike by another person, initial encounter: Secondary | ICD-10-CM | POA: Insufficient documentation

## 2015-08-02 DIAGNOSIS — S8992XA Unspecified injury of left lower leg, initial encounter: Secondary | ICD-10-CM | POA: Insufficient documentation

## 2015-08-02 MED ORDER — OXYCODONE-ACETAMINOPHEN 5-325 MG PO TABS: 1.0000 | ORAL_TABLET | Freq: Four times a day (QID) | ORAL | Status: DC | PRN

## 2015-08-02 MED ORDER — OXYCODONE-ACETAMINOPHEN 5-325 MG PO TABS
1.0000 | ORAL_TABLET | Freq: Once | ORAL | Status: AC
  Administered 2015-08-02: 1 via ORAL
  Filled 2015-08-02: qty 1
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
## 2015-08-02 NOTE — Progress Notes (Signed)
Patient ID: Wendy Dorsey Records, female   DOB: [birth year], 19 y.o.   MRN: [id number]  Chief Complaint  Patient presents with  . Follow-up    IUD Check, cramping     HPI Wendy Dorsey is a 20 y.o. female.  Here for f/u on IUD.  Has been having cramping since insertion roughly 3-4 days ago.  Denies any problem with bleeding.  Has not been sexually active since insertion.  Discussed the normalcy of cramping and irregular bleeding.  Encouraged to give the IUD 6 months.  Patient verbalized understanding.  Strings visualized.  Has not been taking motrin/ibuprophen.       HPI  Past Medical History  Diagnosis Date  . Murmur, cardiac   . Eczema     Past Surgical History  Procedure Laterality Date  . Knee surgery    . Knee surgery    . Mouth surgery      Family History  Problem Relation Age of Onset  . Hypertension Father     Social History Social History  Substance Use Topics  . Smoking status: Never Smoker   . Smokeless tobacco: Never Used  . Alcohol Use: No    Allergies  Allergen Reactions  . Citric Acid Anaphylaxis and Hives  . Tindamax [Tinidazole] Anaphylaxis, Hives and Itching    Patient had to use her Epipen     Current Outpatient Prescriptions  Medication Sig Dispense Refill  . ASMANEX 30 METERED DOSES 110 MCG/INH AEPB Reported on 08/02/2015  5  . EPINEPHrine (EPIPEN 2-PAK) 0.3 mg/0.3 mL IJ SOAJ injection Inject 0.3 mLs (0.3 mg total) into the muscle once. 4 Device 2  . hydrocortisone 2.5 % cream Apply topically 2 (two) times daily as needed. (Patient not taking: Reported on 08/02/2015) 30 g PRN  . mometasone (ELOCON) 0.1 % cream apply to affected area twice daily as needed for rash  0  . mupirocin ointment (BACTROBAN) 2 % Apply 1 application topically 2 (two) times daily as needed. (Patient not taking: Reported on 08/02/2015) 30 g PRN  . PROAIR RESPICLICK 108 (90 Base) MCG/ACT AEPB inhale 2 puffs every 4 hours as needed for shortness of breath or  wheezing  1   No current facility-administered medications for this visit.    Review of Systems Review of Systems Constitutional: negative for fatigue and weight loss Respiratory: negative for cough and wheezing Cardiovascular: negative for chest pain, fatigue and palpitations Gastrointestinal: negative for abdominal pain and change in bowel habits Genitourinary: +cramping Integument/breast: negative for nipple discharge Musculoskeletal:negative for myalgias Neurological: negative for gait problems and tremors Behavioral/Psych: negative for abusive relationship, depression Endocrine: negative for temperature intolerance     Blood pressure 114/71, pulse 91, temperature 98.2 F (36.8 C), weight 147 lb (66.679 kg).  Physical Exam Physical Exam General:   alert  Skin:   no rash or abnormalities  Lungs:   clear to auscultation bilaterally  Heart:   regular rate and rhythm, S1, S2 normal, no murmur, click, rub or gallop  Breasts:   normal without suspicious masses, skin or nipple changes or axillary nodes  Abdomen:  normal findings: no organomegaly, soft, non-tender and no hernia  Pelvis:  External genitalia: normal general appearance Urinary system: urethral meatus normal and bladder without fullness, nontender Vaginal: normal without tenderness, induration or masses Cervix: normal appearance, + IUD strings Adnexa: normal bimanual exam Uterus: anteverted and non-tender, normal size    50% of 15 min visit spent on counseling and coordination of care.  Data Reviewed Previus medical hx, meds  Assessment     IUD in place H/O folliculitis     Plan    Orders Placed This Encounter  Procedures  . SureSwab, Vaginosis/Vaginitis Plus   Meds ordered this encounter  Medications  . hydrocortisone 2.5 % cream    Sig: Apply topically 2 (two) times daily as needed.    Dispense:  30 g    Refill:  PRN  . mupirocin ointment (BACTROBAN) 2 %    Sig: Apply 1 application topically 2  (two) times daily as needed.    Dispense:  30 g    Refill:  PRN     Follow up as needed/1 month for string check.

## 2015-08-02 NOTE — ED Notes (Signed)
Patient transported to X-ray 

## 2015-08-02 NOTE — Discharge Instructions (Signed)
Wear knee immobilizer until you see the orthopedic doctor. Use crutches as needed for comfort. Ice and elevate knee throughout the day. Alternate between ibuprofen and pain medication for pain relief. Do not drive or operate machinery with pain medication use. Call orthopedic clinic tomorrow morning to schedule follow up appointment. Return to ER for any new or worsening symptoms, any additional concerns.
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
## 2015-08-04 ENCOUNTER — Ambulatory Visit (INDEPENDENT_AMBULATORY_CARE_PROVIDER_SITE_OTHER): Payer: Federal, State, Local not specified - PPO

## 2015-08-04 DIAGNOSIS — J309 Allergic rhinitis, unspecified: Secondary | ICD-10-CM | POA: Diagnosis not present

## 2015-08-05 ENCOUNTER — Other Ambulatory Visit: Payer: Self-pay | Admitting: Certified Nurse Midwife

## 2015-08-05 DIAGNOSIS — N760 Acute vaginitis: Secondary | ICD-10-CM

## 2015-08-05 DIAGNOSIS — B9689 Other specified bacterial agents as the cause of diseases classified elsewhere: Secondary | ICD-10-CM

## 2015-08-05 LAB — SURESWAB, VAGINOSIS/VAGINITIS PLUS
ATOPOBIUM VAGINAE: 6.2 Log (cells/mL)
BV CATEGORY: UNDETERMINED — AB
C. ALBICANS, DNA: NOT DETECTED
C. PARAPSILOSIS, DNA: DETECTED — AB
C. TRACHOMATIS RNA, TMA: NOT DETECTED
C. TROPICALIS, DNA: NOT DETECTED
C. glabrata, DNA: NOT DETECTED
Gardnerella vaginalis: 7.7 Log (cells/mL)
LACTOBACILLUS SPECIES: 6.4 Log (cells/mL)
MEGASPHAERA SPECIES: 7.2 Log (cells/mL)
N. gonorrhoeae RNA, TMA: NOT DETECTED
T. VAGINALIS RNA, QL TMA: NOT DETECTED

## 2015-08-05 MED ORDER — CLINDAMYCIN HCL 300 MG PO CAPS: 300.0000 mg | ORAL_CAPSULE | Freq: Two times a day (BID) | ORAL | Status: DC

## 2015-08-05 MED ORDER — TERCONAZOLE 0.4 % VA CREA: 1.0000 | TOPICAL_CREAM | Freq: Every day | VAGINAL | Status: DC

## 2015-08-05 MED ORDER — FLUCONAZOLE 100 MG PO TABS: 100.0000 mg | ORAL_TABLET | Freq: Once | ORAL | Status: DC

## 2015-08-13 ENCOUNTER — Ambulatory Visit (INDEPENDENT_AMBULATORY_CARE_PROVIDER_SITE_OTHER): Payer: Federal, State, Local not specified - PPO

## 2015-08-13 DIAGNOSIS — J309 Allergic rhinitis, unspecified: Secondary | ICD-10-CM

## 2015-08-21 ENCOUNTER — Ambulatory Visit (INDEPENDENT_AMBULATORY_CARE_PROVIDER_SITE_OTHER): Payer: Federal, State, Local not specified - PPO | Admitting: Neurology

## 2015-08-21 DIAGNOSIS — J309 Allergic rhinitis, unspecified: Secondary | ICD-10-CM

## 2015-08-26 ENCOUNTER — Ambulatory Visit (INDEPENDENT_AMBULATORY_CARE_PROVIDER_SITE_OTHER): Payer: Federal, State, Local not specified - PPO

## 2015-08-26 DIAGNOSIS — J309 Allergic rhinitis, unspecified: Secondary | ICD-10-CM

## 2015-08-27 ENCOUNTER — Ambulatory Visit: Payer: Federal, State, Local not specified - PPO | Admitting: Certified Nurse Midwife

## 2015-08-28 ENCOUNTER — Ambulatory Visit (INDEPENDENT_AMBULATORY_CARE_PROVIDER_SITE_OTHER): Payer: Federal, State, Local not specified - PPO | Admitting: Certified Nurse Midwife

## 2015-08-28 ENCOUNTER — Encounter: Payer: Self-pay | Admitting: Certified Nurse Midwife

## 2015-08-28 ENCOUNTER — Ambulatory Visit: Payer: Federal, State, Local not specified - PPO | Admitting: Certified Nurse Midwife

## 2015-08-28 VITALS — BP 123/80 | HR 86 | Wt 147.0 lb

## 2015-08-28 DIAGNOSIS — A499 Bacterial infection, unspecified: Secondary | ICD-10-CM | POA: Diagnosis not present

## 2015-08-28 DIAGNOSIS — N760 Acute vaginitis: Secondary | ICD-10-CM

## 2015-08-28 DIAGNOSIS — N939 Abnormal uterine and vaginal bleeding, unspecified: Secondary | ICD-10-CM | POA: Diagnosis not present

## 2015-08-28 DIAGNOSIS — B9689 Other specified bacterial agents as the cause of diseases classified elsewhere: Secondary | ICD-10-CM

## 2015-08-28 MED ORDER — CLINDAMYCIN HCL 300 MG PO CAPS: 300.0000 mg | ORAL_CAPSULE | Freq: Three times a day (TID) | ORAL | Status: DC

## 2015-08-28 MED ORDER — MEDROXYPROGESTERONE ACETATE 10 MG PO TABS: ORAL_TABLET | ORAL | Status: DC

## 2015-08-28 MED ORDER — IBUPROFEN 800 MG PO TABS: 800.0000 mg | ORAL_TABLET | Freq: Three times a day (TID) | ORAL | Status: AC | PRN

## 2015-08-28 NOTE — Progress Notes (Signed)
Patient ID: Wendy Dorsey, female   DOB: 10-30-1995, 19 y.o.   MRN: [id number]  Chief Complaint  Patient presents with  . Follow-up    iud check up    HPI Wendy Dorsey is a 20 y.o. female.  Here for IUD check up.  Has not been feeling for the strings.  Reports vaginal bleeding since insertion and cramping.  Desires to have it removed.  Has been using tampons and changing them every 4 hours.  Does not use a panty liner with them.  Discussed methods to help with the bleeding.  Encouragement given for the 6 month break in time for the IUD.  States that she lost her Clindamycin when she cleaned out her car, desires a refill.     HPI  Past Medical History  Diagnosis Date  . Murmur, cardiac   . Eczema     Past Surgical History  Procedure Laterality Date  . Knee surgery    . Knee surgery    . Mouth surgery      Family History  Problem Relation Age of Onset  . Hypertension Father     Social History Social History  Substance Use Topics  . Smoking status: Never Smoker   . Smokeless tobacco: Never Used  . Alcohol Use: No    Allergies  Allergen Reactions  . Citric Acid Anaphylaxis and Hives  . Tindamax [Tinidazole] Anaphylaxis, Hives and Itching    Patient had to use her Epipen     Current Outpatient Prescriptions  Medication Sig Dispense Refill  . ASMANEX 30 METERED DOSES 110 MCG/INH AEPB Reported on 08/02/2015  5  . clindamycin (CLEOCIN) 300 MG capsule Take 1 capsule (300 mg total) by mouth 3 (three) times daily. 21 capsule 0  . EPINEPHrine (EPIPEN 2-PAK) 0.3 mg/0.3 mL IJ SOAJ injection Inject 0.3 mLs (0.3 mg total) into the muscle once. (Patient not taking: Reported on 08/28/2015) 4 Device 2  . hydrocortisone 2.5 % cream Apply topically 2 (two) times daily as needed. (Patient not taking: Reported on 08/02/2015) 30 g PRN  . ibuprofen (ADVIL,MOTRIN) 800 MG tablet Take 1 tablet (800 mg total) by mouth every 8 (eight) hours as needed. 60 tablet 1  .  medroxyPROGESTERone (PROVERA) 10 MG tablet Take 20 mg 3 times a day for 7 days, then provera 20 mg daily for 3 weeks. 84 tablet 0  . mometasone (ELOCON) 0.1 % cream Reported on 08/28/2015  0  . mupirocin ointment (BACTROBAN) 2 % Apply 1 application topically 2 (two) times daily as needed. (Patient not taking: Reported on 08/02/2015) 30 g PRN  . PROAIR RESPICLICK 108 (90 Base) MCG/ACT AEPB inhale 2 puffs every 4 hours as needed for shortness of breath or wheezing  1   No current facility-administered medications for this visit.    Review of Systems Review of Systems Constitutional: negative for fatigue and weight loss Respiratory: negative for cough and wheezing Cardiovascular: negative for chest pain, fatigue and palpitations Gastrointestinal: negative for abdominal pain and change in bowel habits Genitourinary:negative Integument/breast: negative for nipple discharge Musculoskeletal:negative for myalgias Neurological: negative for gait problems and tremors Behavioral/Psych: negative for abusive relationship, depression Endocrine: negative for temperature intolerance     Blood pressure 123/80, pulse 86, weight 147 lb (66.679 kg).  Physical Exam Physical Exam General:   alert  Skin:   no rash or abnormalities  Lungs:   clear to auscultation bilaterally  Heart:   regular rate and rhythm, S1, S2 normal, no murmur, click,  rub or gallop  Breasts:   deferred  Abdomen:  normal findings: no organomegaly, soft, non-tender and no hernia  Pelvis:  External genitalia: normal general appearance Urinary system: urethral meatus normal and bladder without fullness, nontender Vaginal: normal without tenderness, induration or masses Cervix: normal appearance, IUD strings present Adnexa: normal bimanual exam Uterus: anteverted and non-tender, normal size    50% of 15 min visit spent on counseling and coordination of care.   Data Reviewed Previous medical hx, meds  Assessment     IUD in  place Irregular bleeding with IUD: normal     Plan    No orders of the defined types were placed in this encounter.   Meds ordered this encounter  Medications  . ibuprofen (ADVIL,MOTRIN) 800 MG tablet    Sig: Take 1 tablet (800 mg total) by mouth every 8 (eight) hours as needed.    Dispense:  60 tablet    Refill:  1  . medroxyPROGESTERone (PROVERA) 10 MG tablet    Sig: Take 20 mg 3 times a day for 7 days, then provera 20 mg daily for 3 weeks.    Dispense:  84 tablet    Refill:  0  . clindamycin (CLEOCIN) 300 MG capsule    Sig: Take 1 capsule (300 mg total) by mouth 3 (three) times daily.    Dispense:  21 capsule    Refill:  0     Possible management options include: Nuva Ring, Depo injection Follow up as needed.

## 2015-10-01 ENCOUNTER — Ambulatory Visit (INDEPENDENT_AMBULATORY_CARE_PROVIDER_SITE_OTHER): Payer: Federal, State, Local not specified - PPO

## 2015-10-01 DIAGNOSIS — J309 Allergic rhinitis, unspecified: Secondary | ICD-10-CM | POA: Diagnosis not present

## 2015-10-12 ENCOUNTER — Ambulatory Visit (INDEPENDENT_AMBULATORY_CARE_PROVIDER_SITE_OTHER): Payer: Federal, State, Local not specified - PPO

## 2015-10-12 DIAGNOSIS — J309 Allergic rhinitis, unspecified: Secondary | ICD-10-CM | POA: Diagnosis not present

## 2015-10-19 ENCOUNTER — Telehealth: Payer: Self-pay

## 2015-10-19 ENCOUNTER — Ambulatory Visit (INDEPENDENT_AMBULATORY_CARE_PROVIDER_SITE_OTHER): Payer: Federal, State, Local not specified - PPO

## 2015-10-19 DIAGNOSIS — J309 Allergic rhinitis, unspecified: Secondary | ICD-10-CM | POA: Diagnosis not present

## 2015-10-19 NOTE — Telephone Encounter (Signed)
Patient came in today to get ITX.  She states she called on Tuesday, April 11th to inform our office of swollen, itchy eyes, and rash on face.  She is clear today.  She does not know who she spoke to and there is no documentation in the computer.  She is requesting a doctor's note for school (college).  I explained to her that it is our protocol that you have to be seen to receive a doctor's note from us.  I advised her I would do it this one time, but in the future she would need to be seen to receive a doctor's note.  Patient verbalizes understanding.  Note given to excuse her from class on April 11th.

## 2015-10-21 ENCOUNTER — Ambulatory Visit (INDEPENDENT_AMBULATORY_CARE_PROVIDER_SITE_OTHER): Payer: Federal, State, Local not specified - PPO | Admitting: Allergy and Immunology

## 2015-10-21 ENCOUNTER — Encounter: Payer: Self-pay | Admitting: Allergy and Immunology

## 2015-10-21 VITALS — BP 112/76 | HR 60 | Resp 18

## 2015-10-21 DIAGNOSIS — J45901 Unspecified asthma with (acute) exacerbation: Secondary | ICD-10-CM | POA: Diagnosis not present

## 2015-10-21 DIAGNOSIS — J3089 Other allergic rhinitis: Secondary | ICD-10-CM | POA: Diagnosis not present

## 2015-10-21 DIAGNOSIS — H1045 Other chronic allergic conjunctivitis: Secondary | ICD-10-CM

## 2015-10-21 DIAGNOSIS — T781XXD Other adverse food reactions, not elsewhere classified, subsequent encounter: Secondary | ICD-10-CM | POA: Diagnosis not present

## 2015-10-21 DIAGNOSIS — H1010 Acute atopic conjunctivitis, unspecified eye: Secondary | ICD-10-CM

## 2015-10-21 DIAGNOSIS — T781XXA Other adverse food reactions, not elsewhere classified, initial encounter: Secondary | ICD-10-CM | POA: Insufficient documentation

## 2015-10-21 DIAGNOSIS — L209 Atopic dermatitis, unspecified: Secondary | ICD-10-CM

## 2015-10-21 MED ORDER — LEVOCETIRIZINE DIHYDROCHLORIDE 5 MG PO TABS: 5.0000 mg | ORAL_TABLET | Freq: Every day | ORAL | Status: DC | PRN

## 2015-10-21 MED ORDER — PREDNISONE 1 MG PO TABS: 10.0000 mg | ORAL_TABLET | ORAL | Status: DC

## 2015-10-21 MED ORDER — BECLOMETHASONE DIPROPIONATE 80 MCG/ACT NA AERS: 1.0000 | INHALATION_SPRAY | Freq: Two times a day (BID) | NASAL | Status: DC

## 2015-10-21 MED ORDER — OLOPATADINE HCL 0.7 % OP SOLN: 1.0000 [drp] | OPHTHALMIC | Status: DC

## 2015-10-21 MED ORDER — MOMETASONE FUROATE 220 MCG/INH IN AEPB: 1.0000 | INHALATION_SPRAY | Freq: Two times a day (BID) | RESPIRATORY_TRACT | Status: DC

## 2015-10-21 MED ORDER — MOMETASONE FUROATE 0.1 % EX CREA: TOPICAL_CREAM | CUTANEOUS | Status: DC

## 2015-10-21 NOTE — Assessment & Plan Note (Signed)
   Continue avoidance of culprit foods. 

## 2015-10-21 NOTE — Progress Notes (Signed)
Follow-up Note  RE: Wendy Dorsey MRN: [id number] DOB: May 19, 1996 Date of Office Visit: 10/21/2015  Primary care provider: No PCP Per Patient Referring provider: No ref. provider found  History of present illness: HPI Comments: Wendy Dorsey is a 20 y.o. female with asthma, allergic rhinoconjunctivitis on immunotherapy, food allergies, and atopic dermatitis who presents today for a sick visit.  She was last seen in this office on 07/21/2015.  With the onset of the spring pollen season she has experienced increased coughing, dyspnea, chest tightness, and wheezing.  She reports that she is experiencing lower respiratory symptoms requiring albuterol rescue multiple times per day.  She denies nocturnal awakenings due to lower respiratory symptoms.  She also complains of ocular pruritus, drainage, and eyelid edema.  She ran out of her medicated eyedrops.  She is recently been experiencing increased nasal congestion but does not like to take nasal sprays because of the sensation of the medication draining down the back of her throat.  She recently restarted aeroallergen immunotherapy buildup, however complains of large local reactions with the injections.   Assessment and plan: Asthma with acute exacerbation  Prednisone has been provided, 20 mg x 4 days, 10 mg x1 day, then stop.  Increase Asmanex to 220g, 1 inhalation twice a day.  A prescription has been provided.  Continue albuterol every 4-6 hours as needed.  The patient has been asked to contact me if her symptoms persist or progress. Otherwise, she may return for follow up in 4 months.  Perennial and seasonal allergic rhinitis Aeroallergen immunotherapy dose will be adjusted to accommodate for recent large local reactions.    Decrease immunotherapy dose by 0.2 mL and hold for 3 rounds of injections.  If tolerated, increase by 0.05 mL increments.  Continue appropriate allergen avoidance measures.  A sample and  prescription have been provided for Qnasl 80 g, one actuation per nostril twice daily as needed.  Proper technique has been discussed and demonstrated.  A prescription has been provided for levocetirizine, 5 mg daily as needed.  To jumpstart symptom relief, prednisone has been provided (as above).  Seasonal allergic conjunctivitis  Treatment plan as outlined above for allergic rhinitis.  A prescription has been provided for Pazeo, one drop per eye daily as needed.  Atopic dermatitis  Continue appropriate skin care measures and mometasone 0.1% cream sparingly to affected areas daily as needed.  Oral allergy syndrome  Continue avoidance of culprit foods.    Meds ordered this encounter  Medications  . Beclomethasone Dipropionate (QNASL) 80 MCG/ACT AERS    Sig: Place 1 spray into both nostrils 2 (two) times daily.    Dispense:  8.7 g    Refill:  5  . levocetirizine (XYZAL) 5 MG tablet    Sig: Take 1 tablet (5 mg total) by mouth daily as needed for allergies.    Dispense:  30 tablet    Refill:  5  . Olopatadine HCl (PAZEO) 0.7 % SOLN    Sig: Place 1 drop into both eyes 1 day or 1 dose.    Dispense:  1 Bottle    Refill:  5  . mometasone (ELOCON) 0.1 % cream    Sig: Apple to affected areas sparingly    Dispense:  15 g    Refill:  5  . mometasone (ASMANEX 30 METERED DOSES) 220 MCG/INH inhaler    Sig: Inhale 1 puff into the lungs 2 (two) times daily.    Dispense:  1 Inhaler  Refill:  5  . predniSONE (DELTASONE) tablet 10 mg    Sig:     Diagnositics: Spirometry reveals an FVC of 3.04 L and an FEV1 of 2.21 L with 210 mL postbronchodilator improvement.    Physical examination: Blood pressure 112/76, pulse 60, resp. rate 18.  General: Alert, interactive, in no acute distress. HEENT: TMs pearly gray, turbinates edematous and pale with clear discharge, post-pharynx erythematous. Neck: Supple without lymphadenopathy. Lungs: Mildly decreased breath sounds bilaterally  without wheezing, rhonchi or rales. CV: Normal S1, S2 without murmurs. Skin: Warm and dry, without lesions or rashes.  The following portions of the patient's history were reviewed and updated as appropriate: allergies, current medications, past family history, past medical history, past social history, past surgical history and problem list.    Medication List       This list is accurate as of: 10/21/15  3:10 PM.  Always use your most recent med list.               ASMANEX 30 METERED DOSES 110 MCG/INH Aepb  Generic drug:  Mometasone Furoate  Reported on 08/02/2015     mometasone 220 MCG/INH inhaler  Commonly known as:  ASMANEX 30 METERED DOSES  Inhale 1 puff into the lungs 2 (two) times daily.     Beclomethasone Dipropionate 80 MCG/ACT Aers  Commonly known as:  QNASL  Place 1 spray into both nostrils 2 (two) times daily.     clindamycin 300 MG capsule  Commonly known as:  CLEOCIN  Take 1 capsule (300 mg total) by mouth 3 (three) times daily.     EPINEPHrine 0.3 mg/0.3 mL Soaj injection  Commonly known as:  EPIPEN 2-PAK  Inject 0.3 mLs (0.3 mg total) into the muscle once.     hydrocortisone 2.5 % cream  Apply topically 2 (two) times daily as needed.     ibuprofen 800 MG tablet  Commonly known as:  ADVIL,MOTRIN  Take 1 tablet (800 mg total) by mouth every 8 (eight) hours as needed.     KYLEENA 19.5 MG Iud  Generic drug:  Levonorgestrel  by Intrauterine route.     levocetirizine 5 MG tablet  Commonly known as:  XYZAL  Take 1 tablet (5 mg total) by mouth daily as needed for allergies.     medroxyPROGESTERone 10 MG tablet  Commonly known as:  PROVERA  Take 20 mg 3 times a day for 7 days, then provera 20 mg daily for 3 weeks.     mometasone 0.1 % cream  Commonly known as:  ELOCON  Apple to affected areas sparingly     mupirocin ointment 2 %  Commonly known as:  BACTROBAN  Apply 1 application topically 2 (two) times daily as needed.     Olopatadine HCl 0.7 %  Soln  Commonly known as:  PAZEO  Place 1 drop into both eyes 1 day or 1 dose.     PROAIR RESPICLICK 108 (90 Base) MCG/ACT Aepb  Generic drug:  Albuterol Sulfate  Reported on 10/21/2015        Allergies  Allergen Reactions  . Citric Acid Anaphylaxis and Hives  . Tindamax [Tinidazole] Anaphylaxis, Hives and Itching    Patient had to use her Epipen    Review of systems: Constitutional: Negative for fever, chills and weight loss.  HENT: Negative for nosebleeds.   Positive for nasal congestion, rhinorrhea. Eyes: Negative for blurred vision.  Positive for ocular pruritus, ocular drainage. Respiratory: Negative for hemoptysis.   Positive  for coughing, dyspnea, wheezing. Cardiovascular: Negative for chest pain.  Gastrointestinal: Negative for diarrhea and constipation.  Genitourinary: Negative for dysuria.  Musculoskeletal: Negative for myalgias and joint pain.  Neurological: Negative for dizziness.  Endo/Heme/Allergies: Does not bruise/bleed easily.   Past Medical History  Diagnosis Date  . Murmur, cardiac   . Eczema     Family History  Problem Relation Age of Onset  . Hypertension Father     Social History   Social History  . Marital Status: Single    Spouse Name: N/A  . Number of Children: N/A  . Years of Education: N/A   Occupational History  . Not on file.   Social History Main Topics  . Smoking status: Never Smoker   . Smokeless tobacco: Never Used  . Alcohol Use: No  . Drug Use: No  . Sexual Activity: Yes    Birth Control/ Protection: Condom, IUD   Other Topics Concern  . Not on file   Social History Narrative    I appreciate the opportunity to take part in this Carie's care. Please do not hesitate to contact me with questions.  Sincerely,   R. Jorene Guestarter Herta Hink, MD

## 2015-10-21 NOTE — Patient Instructions (Addendum)
Asthma with acute exacerbation  Prednisone has been provided, 20 mg x 4 days, 10 mg x1 day, then stop.  Increase Asmanex to 220g, 1 inhalation twice a day.  A prescription has been provided.  Continue albuterol every 4-6 hours as needed.  The patient has been asked to contact me if her symptoms persist or progress. Otherwise, she may return for follow up in 4 months.  Perennial and seasonal allergic rhinitis Aeroallergen immunotherapy dose will be adjusted to accommodate for recent large local reactions.    Decrease immunotherapy dose by 0.2 mL and hold for 3 rounds of injections.  If tolerated, increase by 0.05 mL increments.  Continue appropriate allergen avoidance measures.  A sample and prescription have been provided for Qnasl 80 g, one actuation per nostril twice daily as needed.  Proper technique has been discussed and demonstrated.  A prescription has been provided for levocetirizine, 5 mg daily as needed.  Seasonal allergic conjunctivitis  Treatment plan as outlined above for allergic rhinitis.  A prescription has been provided for Pazeo, one drop per eye daily as needed.  Atopic dermatitis  Continue appropriate skin care measures and mometasone 0.1% cream sparingly to affected areas daily as needed.  Oral allergy syndrome  Continue avoidance of culprit foods.    Return in about 4 months (around 02/20/2016), or if symptoms worsen or fail to improve.

## 2015-10-21 NOTE — Assessment & Plan Note (Signed)
   Continue appropriate skin care measures and mometasone 0.1% cream sparingly to affected areas daily as needed.

## 2015-10-21 NOTE — Assessment & Plan Note (Signed)
   Treatment plan as outlined above for allergic rhinitis.  A prescription has been provided for Pazeo, one drop per eye daily as needed. 

## 2015-10-21 NOTE — Assessment & Plan Note (Addendum)
   Prednisone has been provided, 20 mg x 4 days, 10 mg x1 day, then stop.  Increase Asmanex to 220g, 1 inhalation twice a day.  A prescription has been provided.  Continue albuterol every 4-6 hours as needed.  The patient has been asked to contact me if her symptoms persist or progress. Otherwise, she may return for follow up in 4 months.

## 2015-10-21 NOTE — Assessment & Plan Note (Addendum)
Aeroallergen immunotherapy dose will be adjusted to accommodate for recent large local reactions.    Decrease immunotherapy dose by 0.2 mL and hold for 3 rounds of injections.  If tolerated, increase by 0.05 mL increments.  Continue appropriate allergen avoidance measures.  A sample and prescription have been provided for Qnasl 80 g, one actuation per nostril twice daily as needed.  Proper technique has been discussed and demonstrated.  A prescription has been provided for levocetirizine, 5 mg daily as needed.  To jumpstart symptom relief, prednisone has been provided (as above).

## 2015-12-08 ENCOUNTER — Ambulatory Visit (INDEPENDENT_AMBULATORY_CARE_PROVIDER_SITE_OTHER): Payer: Federal, State, Local not specified - PPO | Admitting: Certified Nurse Midwife

## 2015-12-08 ENCOUNTER — Encounter: Payer: Self-pay | Admitting: Certified Nurse Midwife

## 2015-12-08 VITALS — BP 108/75 | HR 106 | Temp 98.7°F | Wt 157.0 lb

## 2015-12-08 DIAGNOSIS — N760 Acute vaginitis: Secondary | ICD-10-CM | POA: Diagnosis not present

## 2015-12-08 DIAGNOSIS — B373 Candidiasis of vulva and vagina: Secondary | ICD-10-CM | POA: Diagnosis not present

## 2015-12-08 DIAGNOSIS — A499 Bacterial infection, unspecified: Secondary | ICD-10-CM

## 2015-12-08 DIAGNOSIS — Z113 Encounter for screening for infections with a predominantly sexual mode of transmission: Secondary | ICD-10-CM | POA: Diagnosis not present

## 2015-12-08 DIAGNOSIS — Z01812 Encounter for preprocedural laboratory examination: Secondary | ICD-10-CM

## 2015-12-08 DIAGNOSIS — B9689 Other specified bacterial agents as the cause of diseases classified elsewhere: Secondary | ICD-10-CM

## 2015-12-08 DIAGNOSIS — B3731 Acute candidiasis of vulva and vagina: Secondary | ICD-10-CM

## 2015-12-08 MED ORDER — FLUCONAZOLE 200 MG PO TABS: 200.0000 mg | ORAL_TABLET | Freq: Once | ORAL | Status: DC

## 2015-12-08 MED ORDER — METRONIDAZOLE 500 MG PO TABS: 500.0000 mg | ORAL_TABLET | Freq: Two times a day (BID) | ORAL | Status: DC

## 2015-12-08 MED ORDER — TERCONAZOLE 0.8 % VA CREA: 1.0000 | TOPICAL_CREAM | Freq: Every day | VAGINAL | Status: DC

## 2015-12-08 NOTE — Progress Notes (Signed)
Patient ID: Wendy Dorsey, female   DOB: 07-21-1995, 19 y.o.   MRN: [id number]   Chief Complaint  Patient presents with  . Follow-up    HPI Wendy Dorsey RecordsDanielle Dorsey is a 20 y.o. female.  Presents for f/u on IUD.  Reports monthly period, occasionally has two periods a month.  Encouraged patient to keep IUD for several more months.  Has been roughly 4 months since insertion.  Reports recent vaginal discharge with odor and itching.  Is going to have knee surgery in [place] in August and needs labs [facility] be done [facility]      HPI  Past Medical History  Diagnosis Date  . Murmur, cardiac   . Eczema     Past Surgical History  Procedure Laterality Date  . Knee surgery    . Knee surgery    . Mouth surgery      Family History  Problem Relation Age of Onset  . Hypertension Father     Social History Social History  Substance Use Topics  . Smoking status: Never Smoker   . Smokeless tobacco: Never Used  . Alcohol Use: No    Allergies  Allergen Reactions  . Citric Acid Anaphylaxis and Hives  . Tindamax [Tinidazole] Anaphylaxis, Hives and Itching    Patient had to use her Epipen     Current Outpatient Prescriptions  Medication Sig Dispense Refill  . EPINEPHrine (EPIPEN 2-PAK) 0.3 mg/0.3 mL IJ SOAJ injection Inject 0.3 mLs (0.3 mg total) into the muscle once. 4 Device 2  . ibuprofen (ADVIL,MOTRIN) 800 MG tablet Take 1 tablet (800 mg total) by mouth every 8 (eight) hours as needed. 60 tablet 1  . Levonorgestrel (KYLEENA) 19.5 MG IUD by Intrauterine route.    . mometasone (ASMANEX 30 METERED DOSES) 220 MCG/INH inhaler Inhale 1 puff into the lungs 2 (two) times daily. 1 Inhaler 5  . mometasone (ELOCON) 0.1 % cream Apple to affected areas sparingly 15 g 5  . PROAIR RESPICLICK 108 (90 Base) MCG/ACT AEPB Reported on 10/21/2015  1  . fluconazole (DIFLUCAN) 200 MG tablet Take 1 tablet (200 mg total) by mouth once. Repeat dose in 48-72 hours. 3 tablet 0  .  metroNIDAZOLE (FLAGYL) 500 MG tablet Take 1 tablet (500 mg total) by mouth 2 (two) times daily. 14 tablet 0  . terconazole (TERAZOL 3) 0.8 % vaginal cream Place 1 applicator vaginally at bedtime. 20 g 0   Current Facility-Administered Medications  Medication Dose Route Frequency Provider Last Rate Last Dose  . predniSONE (DELTASONE) tablet 10 mg  10 mg Oral UD Cristal Fordalph Carter Bobbitt, MD        Review of Systems Review of Systems Constitutional: negative for fatigue and weight loss Respiratory: negative for cough and wheezing Cardiovascular: negative for chest pain, fatigue and palpitations Gastrointestinal: negative for abdominal pain and change in bowel habits Genitourinary: + vaginal discharge Integument/breast: negative for nipple discharge Musculoskeletal:negative for myalgias Neurological: negative for gait problems and tremors Behavioral/Psych: negative for abusive relationship, depression Endocrine: negative for temperature intolerance     Blood pressure 108/75, pulse 106, temperature 98.7 F (37.1 C), weight 157 lb (71.215 kg).  Physical Exam Physical Exam General:   alert  Skin:   no rash or abnormalities  Lungs:   clear to auscultation bilaterally  Heart:   regular rate and rhythm, S1, S2 normal, no murmur, click, rub or gallop  Breasts:   deferred  Abdomen:  normal findings: no organomegaly, soft, non-tender  and no hernia  Pelvis:  External genitalia: normal general appearance Urinary system: urethral meatus normal and bladder without fullness, nontender Vaginal: normal without tenderness, induration or masses Cervix: normal appearance Adnexa: normal bimanual exam Uterus: anteverted and non-tender, normal size    50% of 15 min visit spent on counseling and coordination of care.   Data Reviewed Previous medical hx, meds, labs  Assessment     Contraception management Pre-procedural lab exam  BV/VVC    Plan    Orders Placed This Encounter  Procedures  .  CBC with Differential/Platelet    Standing Status: Future     Number of Occurrences:      Standing Expiration Date: 12/07/2016  . CMP14+CBC/D/Plt+TSH    Standing Status: Future     Number of Occurrences:      Standing Expiration Date: 12/07/2016    Order Specific Question:  Has the patient fasted?    Answer:  No  . INR/PT    Standing Status: Future     Number of Occurrences:      Standing Expiration Date: 12/07/2016  . PTT    Standing Status: Future     Number of Occurrences:      Standing Expiration Date: 12/07/2016  . Beta HCG, Quant    Standing Status: Future     Number of Occurrences:      Standing Expiration Date: 12/07/2016  . Hepatitis B surface antigen    Standing Status: Future     Number of Occurrences:      Standing Expiration Date: 12/07/2016  . RPR    Standing Status: Future     Number of Occurrences:      Standing Expiration Date: 12/07/2016  . Hepatitis C antibody    Standing Status: Future     Number of Occurrences:      Standing Expiration Date: 12/07/2016  . HIV antibody    Standing Status: Future     Number of Occurrences:      Standing Expiration Date: 12/07/2016  . EKG 12-Lead    Standing Status: Future     Number of Occurrences:      Standing Expiration Date: 12/07/2016    Order Specific Question:  Where should this test be performed    Answer:  [place]   Meds ordered this encounter  Medications  . metroNIDAZOLE (FLAGYL) 500 MG tablet    Sig: Take 1 tablet (500 mg total) by mouth 2 (two) times daily.    Dispense:  14 tablet    Refill:  0  . fluconazole (DIFLUCAN) 200 MG tablet    Sig: Take 1 tablet (200 mg total) by mouth once. Repeat dose in 48-72 hours.    Dispense:  3 tablet    Refill:  0  . terconazole (TERAZOL 3) 0.8 % vaginal cream    Sig: Place 1 applicator vaginally at bedtime.    Dispense:  20 g    Refill:  0     Possible management options include: contraception management Follow up as needed.

## 2015-12-14 LAB — NUSWAB VG+, CANDIDA 6SP
Atopobium vaginae: HIGH Score — AB
CANDIDA ALBICANS, NAA: NEGATIVE
CHLAMYDIA TRACHOMATIS, NAA: NEGATIVE
Candida glabrata, NAA: NEGATIVE
Candida krusei, NAA: NEGATIVE
Candida lusitaniae, NAA: NEGATIVE
Candida parapsilosis, NAA: NEGATIVE
Candida tropicalis, NAA: NEGATIVE
NEISSERIA GONORRHOEAE, NAA: NEGATIVE
TRICH VAG BY NAA: NEGATIVE

## 2016-01-07 ENCOUNTER — Other Ambulatory Visit: Payer: Self-pay | Admitting: Certified Nurse Midwife

## 2016-01-07 ENCOUNTER — Other Ambulatory Visit: Payer: Federal, State, Local not specified - PPO

## 2016-01-07 DIAGNOSIS — Z01812 Encounter for preprocedural laboratory examination: Secondary | ICD-10-CM

## 2016-01-07 DIAGNOSIS — Z113 Encounter for screening for infections with a predominantly sexual mode of transmission: Secondary | ICD-10-CM

## 2016-01-12 LAB — CMP14+CBC/D/PLT+TSH
ALBUMIN: 4.6 g/dL (ref 3.5–5.5)
ALK PHOS: 54 IU/L (ref 39–117)
ALT: 17 IU/L (ref 0–32)
AST: 14 IU/L (ref 0–40)
Albumin/Globulin Ratio: 2 (ref 1.2–2.2)
BASOS: 0 %
BILIRUBIN TOTAL: 0.4 mg/dL (ref 0.0–1.2)
BUN / CREAT RATIO: 10 (ref 9–23)
BUN: 10 mg/dL (ref 6–20)
Basophils Absolute: 0 10*3/uL (ref 0.0–0.2)
CHLORIDE: 102 mmol/L (ref 96–106)
CO2: 22 mmol/L (ref 18–29)
Calcium: 9.2 mg/dL (ref 8.7–10.2)
Creatinine, Ser: 0.96 mg/dL (ref 0.57–1.00)
EOS (ABSOLUTE): 0.2 10*3/uL (ref 0.0–0.4)
EOS: 4 %
GFR calc Af Amer: 98 mL/min/{1.73_m2} (ref >59)
GFR calc non Af Amer: 85 mL/min/{1.73_m2} (ref >59)
GLOBULIN, TOTAL: 2.3 g/dL (ref 1.5–4.5)
GLUCOSE: 77 mg/dL (ref 65–99)
HEMATOCRIT: 40.8 % (ref 34.0–46.6)
HEMOGLOBIN: 13.7 g/dL (ref 11.1–15.9)
Immature Grans (Abs): 0 10*3/uL (ref 0.0–0.1)
Immature Granulocytes: 0 %
LYMPHS ABS: 2.6 10*3/uL (ref 0.7–3.1)
Lymphs: 53 %
MCH: 31.2 pg (ref 26.6–33.0)
MCHC: 33.6 g/dL (ref 31.5–35.7)
MCV: 93 fL (ref 79–97)
MONOS ABS: 0.3 10*3/uL (ref 0.1–0.9)
Monocytes: 5 %
NEUTROS PCT: 38 %
Neutrophils Absolute: 1.8 10*3/uL (ref 1.4–7.0)
POTASSIUM: 4.1 mmol/L (ref 3.5–5.2)
Platelets: 287 10*3/uL (ref 150–379)
RBC: 4.39 x10E6/uL (ref 3.77–5.28)
RDW: 12.9 % (ref 12.3–15.4)
SODIUM: 142 mmol/L (ref 134–144)
TSH: 0.471 u[IU]/mL (ref 0.450–4.500)
Total Protein: 6.9 g/dL (ref 6.0–8.5)
WBC: 4.8 10*3/uL (ref 3.4–10.8)

## 2016-01-12 LAB — APTT: aPTT: 26 s (ref 24–33)

## 2016-01-12 LAB — HEPATITIS C ANTIBODY: Hep C Virus Ab: <0.1 s/co ratio (ref 0.0–0.9)

## 2016-01-12 LAB — NICOTINE/COTININE METABOLITES
Cotinine: 79.3 ng/mL
NICOTINE: 4.7 ng/mL

## 2016-01-12 LAB — BETA HCG QUANT (REF LAB): hCG Quant: <1 m[IU]/mL

## 2016-01-12 LAB — PROTIME-INR
INR: 1.1 (ref 0.8–1.2)
PROTHROMBIN TIME: 11.2 s (ref 9.1–12.0)

## 2016-01-12 LAB — HEPATITIS B SURFACE ANTIGEN: Hepatitis B Surface Ag: NEGATIVE

## 2016-01-12 LAB — RPR: RPR Ser Ql: NONREACTIVE

## 2016-01-12 LAB — HIV ANTIBODY (ROUTINE TESTING W REFLEX): HIV SCREEN 4TH GENERATION: NONREACTIVE

## 2016-01-19 ENCOUNTER — Other Ambulatory Visit: Payer: Self-pay | Admitting: Certified Nurse Midwife

## 2016-01-19 ENCOUNTER — Ambulatory Visit (HOSPITAL_COMMUNITY)
Admission: RE | Admit: 2016-01-19 | Discharge: 2016-01-19 | Disposition: A | Discharge status: Home / Self Care | Payer: Federal, State, Local not specified - PPO | Source: Ambulatory Visit | Attending: Neurology | Admitting: Neurology

## 2016-01-19 DIAGNOSIS — I498 Other specified cardiac arrhythmias: Secondary | ICD-10-CM | POA: Insufficient documentation

## 2016-01-19 DIAGNOSIS — Z01810 Encounter for preprocedural cardiovascular examination: Secondary | ICD-10-CM | POA: Diagnosis present

## 2016-01-19 DIAGNOSIS — Z01812 Encounter for preprocedural laboratory examination: Secondary | ICD-10-CM

## 2016-01-19 DIAGNOSIS — R9431 Abnormal electrocardiogram [ECG] [EKG]: Secondary | ICD-10-CM

## 2016-02-23 ENCOUNTER — Ambulatory Visit: Payer: Federal, State, Local not specified - PPO | Admitting: Allergy and Immunology

## 2016-03-25 ENCOUNTER — Telehealth: Payer: Self-pay | Admitting: *Deleted

## 2016-03-25 NOTE — Telephone Encounter (Signed)
Told mom that she needs to call [employer] before they will process claim - she says she will call - I have sent her a copy of EOB in the past

## 2016-03-25 NOTE — Telephone Encounter (Signed)
Pt mom would like a return call about her bill. Mom is wondering if we filed with both [employer] and [employer]

## 2016-04-11 ENCOUNTER — Other Ambulatory Visit: Payer: Self-pay | Admitting: *Deleted

## 2016-04-11 ENCOUNTER — Ambulatory Visit: Admitting: Certified Nurse Midwife

## 2016-04-11 MED ORDER — CLINDAMYCIN HCL 300 MG PO CAPS: 300.0000 mg | ORAL_CAPSULE | Freq: Two times a day (BID) | ORAL | 0 refills | Status: DC

## 2016-04-11 NOTE — Progress Notes (Signed)
Pt called to office with symptoms of BV. Pt states she would like a Rx. Pt advised will review with Rachelle and will send Rx if approved. Pt has allergy to Tinidazole.  Reviewed with RDenney CNM, clindamycin 300mg  ordered to pharmacy.

## 2016-08-15 ENCOUNTER — Telehealth: Payer: Self-pay

## 2016-08-15 NOTE — Telephone Encounter (Signed)
Pt called, wanting to be triaged by a nurse. Pt states that she has mucous in her chest. She is requesting to make an appointment for a breathing treatment. She states that she was just diagnosed with the flu and is still on Tamiflu. I advised pt to get Mucinex OTC and drink plenty of fluids. If it is not better by Wednesday, she should call us back.

## 2016-08-30 NOTE — Addendum Note (Signed)
Addended by: Berna BueWHITAKER, Jovoni Borkenhagen L on: 08/30/2016 04:03 PM   Modules accepted: Orders

## 2016-09-19 ENCOUNTER — Encounter: Payer: Self-pay | Admitting: Obstetrics

## 2016-09-19 ENCOUNTER — Ambulatory Visit (INDEPENDENT_AMBULATORY_CARE_PROVIDER_SITE_OTHER): Payer: Federal, State, Local not specified - PPO | Admitting: Obstetrics

## 2016-09-19 VITALS — BP 113/76 | HR 82 | Ht 59.0 in | Wt 131.0 lb

## 2016-09-19 DIAGNOSIS — Z113 Encounter for screening for infections with a predominantly sexual mode of transmission: Secondary | ICD-10-CM

## 2016-09-19 DIAGNOSIS — L209 Atopic dermatitis, unspecified: Secondary | ICD-10-CM

## 2016-09-19 DIAGNOSIS — Z0000 Encounter for general adult medical examination without abnormal findings: Secondary | ICD-10-CM

## 2016-09-19 DIAGNOSIS — Z01419 Encounter for gynecological examination (general) (routine) without abnormal findings: Secondary | ICD-10-CM | POA: Diagnosis not present

## 2016-09-19 DIAGNOSIS — Z124 Encounter for screening for malignant neoplasm of cervix: Secondary | ICD-10-CM

## 2016-09-19 DIAGNOSIS — Z30431 Encounter for routine checking of intrauterine contraceptive device: Secondary | ICD-10-CM

## 2016-09-19 MED ORDER — MOMETASONE FUROATE 0.1 % EX CREA: TOPICAL_CREAM | CUTANEOUS | 5 refills | Status: AC

## 2016-09-19 NOTE — Progress Notes (Signed)
Subjective:        Wendy Dorsey is a 21 y.o. female here for a routine exam.  Current complaints: None.    Personal health questionnaire:  Is patient Ashkenazi Jewish, have a family history of breast and/or ovarian cancer: no Is there a family history of uterine cancer diagnosed at age < 6450, gastrointestinal cancer, urinary tract cancer, family member who is a Personnel officerLynch syndrome-associated carrier: no Is the patient overweight and hypertensive, family history of diabetes, personal history of gestational diabetes, preeclampsia or PCOS: no Is patient over 3455, have PCOS,  family history of premature CHD under age 22, diabetes, smoke, have hypertension or peripheral artery disease:  no At any time, has a partner hit, kicked or otherwise hurt or frightened you?: no Over the past 2 weeks, have you felt down, depressed or hopeless?: no Over the past 2 weeks, have you felt little interest or pleasure in doing things?:no   Gynecologic History Patient's last menstrual period was 09/14/2016. Contraception: IUD Last Pap: unknown. Results were: unknown Last mammogram: n/a. Results were: n/a  Obstetric History OB History  Gravida Para Term Preterm AB Living  0 0 0 0 0 0  SAB TAB Ectopic Multiple Live Births  0 0 0 0 0        Past Medical History:  Diagnosis Date  . Eczema   . Murmur, cardiac     Past Surgical History:  Procedure Laterality Date  . KNEE SURGERY    . KNEE SURGERY    . MOUTH SURGERY       Current Outpatient Prescriptions:  .  EPINEPHrine (EPIPEN 2-PAK) 0.3 mg/0.3 mL IJ SOAJ injection, Inject 0.3 mLs (0.3 mg total) into the muscle once., Disp: 4 Device, Rfl: 2 .  Levonorgestrel (KYLEENA) 19.5 MG IUD, by Intrauterine route. , Disp: , Rfl:  .  clindamycin (CLEOCIN) 300 MG capsule, Take 1 capsule (300 mg total) by mouth 2 (two) times daily. For 7 days (Patient not taking: Reported on 09/19/2016), Disp: 14 capsule, Rfl: 0 .  fluconazole (DIFLUCAN) 200 MG tablet,  Take 1 tablet (200 mg total) by mouth once. Repeat dose in 48-72 hours. (Patient not taking: Reported on 09/19/2016), Disp: 3 tablet, Rfl: 0 .  ibuprofen (ADVIL,MOTRIN) 800 MG tablet, Take 1 tablet (800 mg total) by mouth every 8 (eight) hours as needed. (Patient not taking: Reported on 09/19/2016), Disp: 60 tablet, Rfl: 1 .  metroNIDAZOLE (FLAGYL) 500 MG tablet, Take 1 tablet (500 mg total) by mouth 2 (two) times daily. (Patient not taking: Reported on 09/19/2016), Disp: 14 tablet, Rfl: 0 .  mometasone (ASMANEX 30 METERED DOSES) 220 MCG/INH inhaler, Inhale 1 puff into the lungs 2 (two) times daily. (Patient not taking: Reported on 09/19/2016), Disp: 1 Inhaler, Rfl: 5 .  mometasone (ELOCON) 0.1 % cream, Apple to affected areas sparingly (Patient not taking: Reported on 09/19/2016), Disp: 15 g, Rfl: 5 .  PROAIR RESPICLICK 108 (90 Base) MCG/ACT AEPB, Reported on 10/21/2015, Disp: , Rfl: 1 .  terconazole (TERAZOL 3) 0.8 % vaginal cream, Place 1 applicator vaginally at bedtime. (Patient not taking: Reported on 09/19/2016), Disp: 20 g, Rfl: 0  Current Facility-Administered Medications:  .  predniSONE (DELTASONE) tablet 10 mg, 10 mg, Oral, UD, Cristal Fordalph Carter Bobbitt, MD Allergies  Allergen Reactions  . Citric Acid Anaphylaxis and Hives  . Tindamax [Tinidazole] Anaphylaxis, Hives and Itching    Patient had to use her Epipen     Social History  Substance Use Topics  .  Smoking status: Never Smoker  . Smokeless tobacco: Never Used  . Alcohol use No    Family History  Problem Relation Age of Onset  . Hypertension Father   . Diabetes Maternal Aunt       Review of Systems  Constitutional: negative for fatigue and weight loss Respiratory: negative for cough and wheezing Cardiovascular: negative for chest pain, fatigue and palpitations Gastrointestinal: negative for abdominal pain and change in bowel habits Musculoskeletal:negative for myalgias Neurological: negative for gait problems and  tremors Behavioral/Psych: negative for abusive relationship, depression Endocrine: negative for temperature intolerance    Genitourinary:negative for abnormal menstrual periods, genital lesions, hot flashes, sexual problems and vaginal discharge Integument/breast: negative for breast lump, breast tenderness, nipple discharge and skin lesion(s)    Objective:       BP 113/76   Pulse 82   Ht 4\' 11"  (1.499 m)   Wt 131 lb (59.4 kg)   LMP 09/14/2016   BMI 26.46 kg/m  General:   alert  Skin:   no rash or abnormalities  Lungs:   clear to auscultation bilaterally  Heart:   regular rate and rhythm, S1, S2 normal, no murmur, click, rub or gallop  Breasts:   normal without suspicious masses, skin or nipple changes or axillary nodes  Abdomen:  normal findings: no organomegaly, soft, non-tender and no hernia  Pelvis:  External genitalia: normal general appearance Urinary system: urethral meatus normal and bladder without fullness, nontender Vaginal: normal without tenderness, induration or masses Cervix: normal appearance Adnexa: normal bimanual exam Uterus: anteverted and non-tender, normal size   Lab Review Urine pregnancy test Labs reviewed yes Radiologic studies reviewed no  50% of 20 min visit spent on counseling and coordination of care.    Assessment:    Healthy female exam.    IUD Surveillance.  Pleased with IUD  Atopic dermatitis   Plan:   [place] Rx for Dermatitis  Education reviewed: low fat, low cholesterol diet, safe sex/STD prevention, self breast exams and weight bearing exercise. Contraception: IUD. Follow up in: 1 year.   No orders of the defined types were placed in this encounter.  Orders Placed This Encounter  Procedures  . Hepatitis B surface antigen  . Hepatitis C antibody  . HIV antibody  . RPR

## 2016-09-19 NOTE — Progress Notes (Signed)
Pt presents for annual, pap, and all STD testing. Pt c/o R side knot in groin area. No pain per pt.

## 2016-09-20 LAB — RPR: RPR: NONREACTIVE

## 2016-09-20 LAB — CERVICOVAGINAL ANCILLARY ONLY
Bacterial vaginitis: NEGATIVE
CANDIDA VAGINITIS: POSITIVE — AB
Chlamydia: NEGATIVE
Neisseria Gonorrhea: NEGATIVE
Trichomonas: NEGATIVE

## 2016-09-20 LAB — HEPATITIS B SURFACE ANTIGEN: HEP B S AG: NEGATIVE

## 2016-09-20 LAB — HIV ANTIBODY (ROUTINE TESTING W REFLEX): HIV Screen 4th Generation wRfx: NONREACTIVE

## 2016-09-20 LAB — HEPATITIS C ANTIBODY: Hep C Virus Ab: <0.1 s/co ratio (ref 0.0–0.9)

## 2016-09-21 ENCOUNTER — Other Ambulatory Visit: Payer: Self-pay | Admitting: Obstetrics

## 2016-09-21 DIAGNOSIS — B373 Candidiasis of vulva and vagina: Secondary | ICD-10-CM

## 2016-09-21 DIAGNOSIS — B3731 Acute candidiasis of vulva and vagina: Secondary | ICD-10-CM

## 2016-09-21 MED ORDER — FLUCONAZOLE 200 MG PO TABS: 200.0000 mg | ORAL_TABLET | ORAL | 0 refills | Status: AC

## 2016-09-22 ENCOUNTER — Encounter: Payer: Self-pay | Admitting: Allergy & Immunology

## 2016-09-22 ENCOUNTER — Ambulatory Visit (INDEPENDENT_AMBULATORY_CARE_PROVIDER_SITE_OTHER): Payer: Federal, State, Local not specified - PPO | Admitting: Allergy & Immunology

## 2016-09-22 VITALS — BP 108/68 | HR 80 | Resp 20 | Ht 59.0 in | Wt 126.2 lb

## 2016-09-22 DIAGNOSIS — J3089 Other allergic rhinitis: Secondary | ICD-10-CM

## 2016-09-22 DIAGNOSIS — J111 Influenza due to unidentified influenza virus with other respiratory manifestations: Secondary | ICD-10-CM

## 2016-09-22 DIAGNOSIS — J4540 Moderate persistent asthma, uncomplicated: Secondary | ICD-10-CM

## 2016-09-22 MED ORDER — ALBUTEROL SULFATE HFA 108 (90 BASE) MCG/ACT IN AERS: 2.0000 | INHALATION_SPRAY | RESPIRATORY_TRACT | 1 refills | Status: DC | PRN

## 2016-09-22 MED ORDER — BUDESONIDE-FORMOTEROL FUMARATE 160-4.5 MCG/ACT IN AERO
2.0000 | INHALATION_SPRAY | Freq: Two times a day (BID) | RESPIRATORY_TRACT | 5 refills | Status: AC
Start: 2016-09-22 — End: ?

## 2016-09-22 NOTE — Progress Notes (Signed)
FOLLOW UP  Date of Service/Encounter:  09/22/16   Assessment:   Moderate persistent asthma without complication  Chronic nonseasonal allergic rhinitis  Recent influenza infection    Plan/Recommendations:   1. Moderate persistent asthma with chronic cough likely multifactorial and associated with both a recent viral infection and uncontrolled AR with postnasal drip - Lung function was normal today.  - Since you are experiencing daily symptoms of shortness of breath, we will change you to a different medication. - Stop the Asmanex and start Symbicort 160/4.5 two puffs twice daily. - I think that she would feel much better if she would at least take two puffs once daily.  - Daily controller medication(s): Symbicort 160/4.5 two puffs twice daily  - Rescue medications: ProAir 4 puffs every 4-6 hours as needed - Asthma control goals:  * Full participation in all desired activities (may need albuterol before activity) * Albuterol use two time or less a week on average (not counting use with activity) * Cough interfering with sleep two time or less a month * Oral steroids no more than once a year * No hospitalizations  2. Chronic nonseasonal allergic rhinitis due to pollen - Restart allergy shots.  - You need to continue for a total of 3-5 years for the maximized effect of re-training your immune system. - Restart your nasal steroid. - Restart your antihistamine (Xyzal 5mg  daily).  - Start the prednisone pack provided to help kickstart symptom relief.  3. Return in about 3 months (around 12/23/2016).   Subjective:   Wendy Dorsey is a 21 y.o. female presenting today for follow up of  Chief Complaint  Patient presents with  . Asthma  . Cough    waking patient up during the night  . Wheezing    Wendy Dorsey has a history of the following: Patient Active Problem List   Diagnosis Date Noted  . Asthma with acute exacerbation 10/21/2015  . Perennial  and seasonal allergic rhinitis 10/21/2015  . Seasonal allergic conjunctivitis 10/21/2015  . Atopic dermatitis 10/21/2015  . Oral allergy syndrome 10/21/2015  . IUD contraception 08/02/2015  . Encounter for IUD insertion 07/28/2015  . Trichomonas vaginitis 03/26/2013  . Chlamydia trachomatis infection of lower genitourinary sites 03/26/2013  . BV (bacterial vaginosis) 03/26/2013  . Gonorrhea in female 03/26/2013  . Excessive or frequent menstruation 03/21/2013    History obtained from: chart review and patient.  Wendy Dorsey was referred by No PCP Per Patient.     Wendy Dorsey is a 21 y.o. female presenting for a follow up visit. She was last seen in April 2017 by Dr. Nunzio CobbsBobbitt. At that time, she was diagnosed with an asthma exacerbation. She was given an course of prednisone. She was asked to increase her Asmanex to 1 inhalation twice a day as well as continue with albuterol as needed. She has a history of allergic rhinitis and was on regular allergy shots, but has not had a shot in around 1 year. She was continued on Qnasl as well as Xyzal. Her atopic dermatitis was controlled with mometasone 0.1% cream as needed.  Since the last visit she has done mostly better. She was diagnose with the flu one month ago and was treated with Wendy Dorsey. She has had a terrible cough since the last visit. She has not been using her ProAir. She does not use the Asmanex and has not used it in quite some time. Her last few puffs of the ProAir this morning. She does not  remember the last time that she used her Asmanex. Honestly she did not notice much of a difference on it. Prior to this recent illness, her cough was fairly well controlled. Currently she is endorsing chest heaviness and wheezing. She endorse feeling SOB on a daily basis even before this recent bout with the influenza.   She did stop getting her shots because she was not having any allergy symptoms. She was to the point when she did not have any  symptoms and did not need medications. Therefore she stopped coming. She did have large local reactions to the injections as well. She has been on them off and on for two years. Spring is her worst time of the year and she is planning on restarting them.   Otherwise, there have been no changes to her past medical history, surgical history, family history, or social history.    Review of Systems: a 14-point review of systems is pertinent for what is mentioned in HPI.  Otherwise, all other systems were negative. Constitutional: negative other than that listed in the HPI Eyes: negative other than that listed in the HPI Ears, nose, mouth, throat, and face: negative other than that listed in the HPI Respiratory: negative other than that listed in the HPI Cardiovascular: negative other than that listed in the HPI Gastrointestinal: negative other than that listed in the HPI Genitourinary: negative other than that listed in the HPI Integument: negative other than that listed in the HPI Hematologic: negative other than that listed in the HPI Musculoskeletal: negative other than that listed in the HPI Neurological: negative other than that listed in the HPI Allergy/Immunologic: negative other than that listed in the HPI    Objective:   Blood pressure 108/68, pulse 80, resp. rate 20, height 4\' 11"  (1.499 m), weight 126 lb 3.2 oz (57.2 kg), last menstrual period 09/14/2016. Body mass index is 25.49 kg/m.   Physical Exam:  General: Alert, interactive, in no acute distress. Pleasant. Hacking dry cough. Eyes: Conjunctival injection on the right with limbal sparing, Conjunctival injection on the left with limbal sparing, PERRL bilaterally, No discharge on the right, No discharge on the left, No Horner-Trantas dots present and allergic shiners present bilaterally Ears: Right TM pearly gray with normal light reflex, Left TM pearly gray with normal light reflex, Right TM intact without perforation and  Left TM intact without perforation.  Nose/Throat: External nose within normal limits and septum midline, turbinates markedly edematous and pale with clear discharge, post-pharynx markedly erythematous with cobblestoning in the posterior oropharynx. Tonsils 2+ without exudates Neck: Supple without thyromegaly. Lungs: Clear to auscultation without wheezing, rhonchi or rales. No increased work of breathing. CV: Normal S1/S2, no murmurs. Capillary refill <2 seconds.  Skin: Warm and dry, without lesions or rashes. Neuro:   Grossly intact. No focal deficits appreciated. Responsive to questions.   Diagnostic studies:  Spirometry: results normal (FEV1: 2.32/97%, FVC: 2.95/110%, FEV1/FVC: 78%).    Spirometry consistent with normal pattern.  . Allergy Studies: none     Malachi Bonds, MD [facility]

## 2016-09-22 NOTE — Patient Instructions (Addendum)
1. Moderate persistent asthma without complication - Lung function was normal today.  - Since you are experiencing daily symptoms of shortness of breath, we will change you to a different medication. - Stop the Asmanex and start Symbicort 160/4.5 two puffs twice daily. - Daily controller medication(s): Symbicort 160/4.5 two puffs twice daily - Rescue medications: ProAir 4 puffs every 4-6 hours as needed - Asthma control goals:  * Full participation in all desired activities (may need albuterol before activity) * Albuterol use two time or less a week on average (not counting use with activity) * Cough interfering with sleep two time or less a month * Oral steroids no more than once a year * No hospitalizations  2. Chronic nonseasonal allergic rhinitis due to pollen - Restart allergy shots.  - You need to continue for a total of 3-5 years for the maximized effect of re-training your immune system. - Restart your nasal steroid. - Restart your antihistamine (Xyzal 5mg  daily).  - Start the prednisone pack provided to help kickstart symptom relief.  3. Return in about 3 months (around 12/23/2016).  Please inform us of any Emergency Department visits, hospitalizations, or changes in symptoms. Call us before going to the ED for breathing or allergy symptoms since we might be able to fit you in for a sick visit. Feel free to contact us anytime with any questions, problems, or concerns.  It was a pleasure to meet you today! Happy spring!   Websites that have reliable patient information: 1. American Academy of Asthma, Allergy, and Immunology: www.aaaai.org 2. Food Allergy Research and Education (FARE): foodallergy.org 3. Mothers of Asthmatics: http://www.asthmacommunitynetwork.org 4. American College of Allergy, Asthma, and Immunology: www.acaai.org

## 2016-09-23 LAB — CYTOLOGY - PAP
DIAGNOSIS: UNDETERMINED — AB
HPV (WINDOPATH): NOT DETECTED

## 2016-09-26 ENCOUNTER — Encounter: Payer: Self-pay | Admitting: *Deleted

## 2016-10-06 DIAGNOSIS — J3089 Other allergic rhinitis: Secondary | ICD-10-CM | POA: Diagnosis not present

## 2016-10-07 DIAGNOSIS — J3089 Other allergic rhinitis: Secondary | ICD-10-CM

## 2016-10-11 ENCOUNTER — Telehealth: Payer: Self-pay | Admitting: Allergy and Immunology

## 2016-10-11 MED ORDER — OLOPATADINE HCL 0.2 % OP SOLN: 1.0000 [drp] | Freq: Two times a day (BID) | OPHTHALMIC | 5 refills | Status: AC

## 2016-10-11 NOTE — Telephone Encounter (Signed)
rx sent in 

## 2016-10-11 NOTE — Telephone Encounter (Signed)
Dr Dellis Anes advise what to send in for patient this is not in your note

## 2016-10-11 NOTE — Telephone Encounter (Signed)
She returned the call. She said you can call her back.

## 2016-10-11 NOTE — Telephone Encounter (Signed)
Let's send in Pataday one drop per eye BID. Thanks!   Malachi Bonds, MD FAAAAI [facility]

## 2016-10-11 NOTE — Telephone Encounter (Signed)
patient needs a refill on her eye drops sent to [facility] on [address]

## 2016-10-13 ENCOUNTER — Ambulatory Visit (INDEPENDENT_AMBULATORY_CARE_PROVIDER_SITE_OTHER): Payer: Federal, State, Local not specified - PPO | Admitting: *Deleted

## 2016-10-13 DIAGNOSIS — J309 Allergic rhinitis, unspecified: Secondary | ICD-10-CM

## 2016-10-14 ENCOUNTER — Encounter: Payer: Self-pay | Admitting: *Deleted

## 2016-10-14 ENCOUNTER — Other Ambulatory Visit: Payer: Self-pay | Admitting: Allergy and Immunology

## 2016-10-14 DIAGNOSIS — J309 Allergic rhinitis, unspecified: Secondary | ICD-10-CM

## 2016-10-14 NOTE — Progress Notes (Signed)
4 vial set made. Exp: 10-14-17. hc

## 2016-10-24 NOTE — Progress Notes (Signed)
Immunotherapy   Patient Details  Name: Chania Avika Carbine MRN: [id number] Date of Birth: [birth year]  10/13/2016  [place] Records started injections for  [employer] [place]. Following schedule: B  Frequency:2 times per week Epi-Pen:Epi-Pen Available  Consent signed and patient instructions given. No problems after 30 minutes in office. Patient will start her 3rd injection at her next visit.    Vella Redhead 10/14/2016, 10:56 AM

## 2016-11-03 NOTE — Addendum Note (Signed)
Addended by: Berna BueWHITAKER, Aries Townley L on: 11/03/2016 12:13 PM   Modules accepted: Orders

## 2016-11-29 ENCOUNTER — Ambulatory Visit: Payer: Federal, State, Local not specified - PPO | Admitting: Obstetrics

## 2016-12-12 ENCOUNTER — Other Ambulatory Visit: Payer: Self-pay | Admitting: Certified Nurse Midwife
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
## 2018-06-29 ENCOUNTER — Ambulatory Visit: Payer: Federal, State, Local not specified - PPO | Admitting: Obstetrics & Gynecology

## 2018-07-19 ENCOUNTER — Encounter: Payer: Self-pay | Admitting: Certified Nurse Midwife

## 2018-07-19 ENCOUNTER — Encounter: Payer: Self-pay | Admitting: Obstetrics

## 2018-07-19 ENCOUNTER — Ambulatory Visit (INDEPENDENT_AMBULATORY_CARE_PROVIDER_SITE_OTHER): Payer: Federal, State, Local not specified - PPO | Admitting: Certified Nurse Midwife

## 2018-07-19 VITALS — BP 97/67 | HR 66 | Ht 59.0 in | Wt 129.4 lb

## 2018-07-19 DIAGNOSIS — Z124 Encounter for screening for malignant neoplasm of cervix: Secondary | ICD-10-CM

## 2018-07-19 DIAGNOSIS — Z113 Encounter for screening for infections with a predominantly sexual mode of transmission: Secondary | ICD-10-CM

## 2018-07-19 DIAGNOSIS — J4521 Mild intermittent asthma with (acute) exacerbation: Secondary | ICD-10-CM | POA: Diagnosis not present

## 2018-07-19 DIAGNOSIS — Z01419 Encounter for gynecological examination (general) (routine) without abnormal findings: Secondary | ICD-10-CM | POA: Diagnosis not present

## 2018-07-19 MED ORDER — ALBUTEROL SULFATE HFA 108 (90 BASE) MCG/ACT IN AERS: 2.0000 | INHALATION_SPRAY | RESPIRATORY_TRACT | 1 refills | Status: AC | PRN

## 2018-07-19 NOTE — Progress Notes (Signed)
GYNECOLOGY ANNUAL PREVENTATIVE CARE ENCOUNTER NOTE  Subjective:   Wendy Dorsey is a 23 y.o. G0P0000 female here for a routine annual gynecologic exam.  Current complaints: cough and wheezing. Denies abnormal vaginal bleeding, discharge, pelvic pain, problems with intercourse or other gynecologic concerns. Patient request STD screening.    Gynecologic History Patient's last menstrual period was 07/19/2018 (exact date). Contraception: IUD Last Pap: 2018. Results were: abnormal (ASC-US) with negative HPV  Obstetric History OB History  Gravida Para Term Preterm AB Living  0 0 0 0 0 0  SAB TAB Ectopic Multiple Live Births  0 0 0 0 0    Past Medical History:  Diagnosis Date  . Eczema   . Murmur, cardiac     Past Surgical History:  Procedure Laterality Date  . KNEE SURGERY    . KNEE SURGERY    . MOUTH SURGERY      Current Outpatient Medications on File Prior to Visit  Medication Sig Dispense Refill  . budesonide-formoterol (SYMBICORT) 160-4.5 MCG/ACT inhaler Inhale 2 puffs into the lungs 2 (two) times daily. 1 Inhaler 5  . EPINEPHrine (EPIPEN 2-PAK) 0.3 mg/0.3 mL IJ SOAJ injection Inject 0.3 mLs (0.3 mg total) into the muscle once. 4 Device 2  . Levonorgestrel (KYLEENA) 19.5 MG IUD by Intrauterine route.     . fluconazole (DIFLUCAN) 200 MG tablet Take 1 tablet (200 mg total) by mouth every other day. (Patient not taking: Reported on 09/22/2016) 3 tablet 0  . ibuprofen (ADVIL,MOTRIN) 800 MG tablet Take 1 tablet (800 mg total) by mouth every 8 (eight) hours as needed. (Patient not taking: Reported on 09/22/2016) 60 tablet 1  . mometasone (ELOCON) 0.1 % cream Apple to affected areas sparingly bid prn (Patient not taking: Reported on 09/22/2016) 45 g 5  . Olopatadine HCl (PATADAY) 0.2 % SOLN Place 1 drop into both eyes 2 (two) times daily. (Patient not taking: Reported on 07/19/2018) 2 Bottle 5   No current facility-administered medications on file prior to visit.      Allergies  Allergen Reactions  . Citric Acid Anaphylaxis and Hives  . Tindamax [Tinidazole] Anaphylaxis, Hives and Itching    Patient had to use her Epipen     Social History:  reports that she has never smoked. She has never used smokeless tobacco. She reports current alcohol use. She reports current drug use. Drug: Marijuana.  Family History  Problem Relation Age of Onset  . Hypertension Father   . Diabetes Maternal Aunt     The following portions of the patient's history were reviewed and updated as appropriate: allergies, current medications, past family history, past medical history, past social history, past surgical history and problem list.  Review of Systems Pertinent items noted in HPI and remainder of comprehensive ROS otherwise negative.   Objective:  BP 97/67   Pulse 66   Ht 4\' 11"  (1.499 m)   Wt 129 lb 6.4 oz (58.7 kg)   LMP 07/19/2018 (Exact Date)   BMI 26.14 kg/m  CONSTITUTIONAL: Well-developed, well-nourished female in no acute distress.  HENT:  Normocephalic, atraumatic, External right and left ear normal. Oropharynx is clear and moist EYES: Conjunctivae and EOM are normal. Pupils are equal, round, and reactive to light.  NECK: Normal range of motion, supple, no masses.  Normal thyroid.  SKIN: Skin is warm and dry. No rash noted. Not diaphoretic. No erythema. No pallor. MUSCULOSKELETAL: Normal range of motion. No tenderness.  No cyanosis, clubbing, or edema.  2+ distal  pulses. NEUROLOGIC: Alert and oriented to person, place, and time. Normal reflexes, muscle tone coordination. No cranial nerve deficit noted. PSYCHIATRIC: Normal mood and affect. Normal behavior. Normal judgment and thought content. CARDIOVASCULAR: Normal heart rate noted, regular rhythm RESPIRATORY:  Effort and breath sounds normal, no problems with respiration noted. Bilateral wheezing present in lower posterior lobe. BREASTS: Symmetric in size. No masses, skin changes, nipple drainage,  or lymphadenopathy. Bilateral nipple piercing present on examination.  ABDOMEN: Soft, normal bowel sounds, no distention noted.  No tenderness, rebound or guarding.  PELVIC: Normal appearing external genitalia; normal appearing vaginal mucosa and cervix. Scant dark red vaginal bleeding present. IUD strings seen. Pap smear obtained.  Normal uterine size, no other palpable masses, no uterine or adnexal tenderness.  Assessment and Plan:  1. Encounter for annual routine gynecological examination - Normal well woman examination  - ASCUS with pap in 2018, discussed management if abnormal pap this year  - Kyleena in place- no problems or concerns good for 2 more years  - Cytology - PAP( [place])  2. Screening examination for STD (sexually transmitted disease) - Cytology - PAP( ) - HIV Antibody (routine testing w rflx) - RPR - Hepatitis C Antibody - Hepatitis B Surface AntiGEN  3. Intermittent asthma with acute exacerbation, unspecified asthma severity - Patient reports being out of her inhaler, refill sent to pharmacy for use  - Encouraged patient to quit smoking THC, smoke inhalation can worsen symptoms of asthma, patient verbalizes understanding  - albuterol (PROAIR HFA) 108 (90 Base) MCG/ACT inhaler; Inhale 2 puffs into the lungs every 4 (four) hours as needed for wheezing or shortness of breath.  Dispense: 1 Inhaler; Refill: 1  Will follow up results of pap smear and manage accordingly. Routine preventative health maintenance measures emphasized. Please refer to After Visit Summary for other counseling recommendations.    Sharyon Cable, CNM [facility] [employer], [facility]

## 2018-07-19 NOTE — Progress Notes (Signed)
Patient presents for Annual Exam.  Last pap:09/19/2016 ASCUS  STD Screening: Desires all  Contraception: IUD  LMP: today   CC: cough

## 2018-07-20 LAB — HEPATITIS C ANTIBODY: Hep C Virus Ab: <0.1 s/co ratio (ref 0.0–0.9)

## 2018-07-20 LAB — HIV ANTIBODY (ROUTINE TESTING W REFLEX): HIV Screen 4th Generation wRfx: NONREACTIVE

## 2018-07-20 LAB — RPR: RPR Ser Ql: NONREACTIVE

## 2018-07-20 LAB — HEPATITIS B SURFACE ANTIGEN: Hepatitis B Surface Ag: NEGATIVE

## 2018-07-24 LAB — CYTOLOGY - PAP
Chlamydia: NEGATIVE
Neisseria Gonorrhea: NEGATIVE
Trichomonas: NEGATIVE

## 2019-08-15 ENCOUNTER — Other Ambulatory Visit: Payer: Self-pay

## 2019-08-15 ENCOUNTER — Ambulatory Visit (INDEPENDENT_AMBULATORY_CARE_PROVIDER_SITE_OTHER): Payer: Federal, State, Local not specified - PPO

## 2019-08-15 ENCOUNTER — Encounter: Payer: Self-pay | Admitting: Obstetrics

## 2019-08-15 VITALS — BP 106/54 | HR 77 | Ht 59.0 in | Wt 127.2 lb

## 2019-08-15 DIAGNOSIS — Z113 Encounter for screening for infections with a predominantly sexual mode of transmission: Secondary | ICD-10-CM

## 2019-08-15 NOTE — Patient Instructions (Signed)

## 2019-08-15 NOTE — Progress Notes (Signed)
   GYNECOLOGY PROBLEM OFFICE VISIT NOTE  History:  24 y.o. G0P0000 here today for STD screening. She states she had a "threesome with another girl on Sunday and just wants to be tested."   She denies any abnormal vaginal discharge, bleeding, pelvic pain or pain or discomfort during sex. She denies sore throat or cough.   Past Medical History:  Diagnosis Date  . Eczema   . Murmur, cardiac     Past Surgical History:  Procedure Laterality Date  . KNEE SURGERY    . KNEE SURGERY    . MOUTH SURGERY      The following portions of the patient's history were reviewed and updated as appropriate: allergies, current medications, past family history, past medical history, past social history, past surgical history and problem list.   Health Maintenance: LGSIL pap with some results suggestive of HGSIL in Jan 2020. Review of Systems:  Genito-Urinary ROS: no dysuria, trouble voiding, or hematuria   Objective:  Vitals: BP (!) 106/54   Pulse 77   Ht 4\' 11"  (1.499 m)   Wt 127 lb 3.2 oz (57.7 kg)   BMI 25.69 kg/m   Physical Exam: Physical Exam Constitutional:      Appearance: Normal appearance. She is normal weight.  Genitourinary:     No vulval lesion, tenderness, ulcerations or Bartholin's cyst noted.     No urethral pain or tenderness present.     Uterus is not enlarged or tender.     No uterine mass detected.    Genitourinary Comments: CV collected via blind swab. BME without significance.   HENT:     Head: Normocephalic and atraumatic.  Eyes:     Conjunctiva/sclera: Conjunctivae normal.  Cardiovascular:     Rate and Rhythm: Normal rate and regular rhythm.     Heart sounds: Normal heart sounds.  Pulmonary:     Effort: Pulmonary effort is normal.  Abdominal:     General: Abdomen is flat. Bowel sounds are normal.     Palpations: Abdomen is soft.  Musculoskeletal:     Cervical back: Normal range of motion.  Neurological:     Mental Status: She is alert.  Skin:    General:  Skin is warm and dry.  Psychiatric:        Mood and Affect: Mood normal.        Behavior: Behavior normal.        Thought Content: Thought content normal.      Labs and Imaging: No results found.  Assessment & Plan:  24 year old Unprotected Sexual Encounter H/O LGSIL  1. Screening examination for STD (sexually transmitted disease)     -Discussed safe sex practices. -Encouraged to monitor symptoms as they may not manifest immediately.  Informed that she can return to office for further testing if necessary. -Informed that she would be contacted for abnormal results, but otherwise, "no news is good news." -Reviewed previous pap results.  Extensive discussion on cervical changes at current age and how smoking contributes to abnormal cells. -Informed of need for repeat pap at next annual visit. -RTO in 4-6 weeks for annual exam or prn    24, CNM 08/15/2019 9:03 AM

## 2019-08-15 NOTE — Progress Notes (Signed)
Pt presents for STD screening. 07/19/2018 LGSIL suggestive high grade lesion

## 2019-08-16 LAB — CERVICOVAGINAL ANCILLARY ONLY
Chlamydia: NEGATIVE
Comment: NEGATIVE
Comment: NEGATIVE
Comment: NORMAL
Neisseria Gonorrhea: NEGATIVE
Trichomonas: NEGATIVE

## 2019-08-16 LAB — HIV ANTIBODY (ROUTINE TESTING W REFLEX): HIV Screen 4th Generation wRfx: NONREACTIVE

## 2019-08-16 LAB — HEPATITIS C ANTIBODY: Hep C Virus Ab: <0.1 s/co ratio (ref 0.0–0.9)

## 2019-08-16 LAB — HEPATITIS B SURFACE ANTIGEN: Hepatitis B Surface Ag: NEGATIVE

## 2019-08-16 LAB — RPR: RPR Ser Ql: NONREACTIVE

## 2019-09-19 ENCOUNTER — Other Ambulatory Visit: Payer: Self-pay

## 2019-09-19 ENCOUNTER — Ambulatory Visit (INDEPENDENT_AMBULATORY_CARE_PROVIDER_SITE_OTHER): Payer: Federal, State, Local not specified - PPO

## 2019-09-19 VITALS — BP 93/61 | Ht 59.0 in | Wt 127.4 lb

## 2019-09-19 DIAGNOSIS — Z975 Presence of (intrauterine) contraceptive device: Secondary | ICD-10-CM | POA: Diagnosis not present

## 2019-09-19 DIAGNOSIS — Z8742 Personal history of other diseases of the female genital tract: Secondary | ICD-10-CM

## 2019-09-19 DIAGNOSIS — Z01419 Encounter for gynecological examination (general) (routine) without abnormal findings: Secondary | ICD-10-CM

## 2019-09-19 DIAGNOSIS — J4520 Mild intermittent asthma, uncomplicated: Secondary | ICD-10-CM | POA: Diagnosis not present

## 2019-09-19 DIAGNOSIS — Z1151 Encounter for screening for human papillomavirus (HPV): Secondary | ICD-10-CM

## 2019-09-19 DIAGNOSIS — Z124 Encounter for screening for malignant neoplasm of cervix: Secondary | ICD-10-CM | POA: Diagnosis not present

## 2019-09-19 MED ORDER — ALBUTEROL SULFATE HFA 108 (90 BASE) MCG/ACT IN AERS: 2.0000 | INHALATION_SPRAY | Freq: Four times a day (QID) | RESPIRATORY_TRACT | 1 refills | Status: AC | PRN

## 2019-09-19 NOTE — Patient Instructions (Signed)
Exercising to Stay Healthy To become healthy and stay healthy, it is recommended that you do moderate-intensity and vigorous-intensity exercise. You can tell that you are exercising at a moderate intensity if your heart starts beating faster and you start breathing faster but can still hold a conversation. You can tell that you are exercising at a vigorous intensity if you are breathing much harder and faster and cannot hold a conversation while exercising. Exercising regularly is important. It has many health benefits, such as:  Improving overall fitness, flexibility, and endurance.  Increasing bone density.  Helping with weight control.  Decreasing body fat.  Increasing muscle strength.  Reducing stress and tension.  Improving overall health. How often should I exercise? Choose an activity that you enjoy, and set realistic goals. Your health care provider can help you make an activity plan that works for you. Exercise regularly as told by your health care provider. This may include:  Doing strength training two times a week, such as: ? Lifting weights. ? Using resistance bands. ? Push-ups. ? Sit-ups. ? Yoga.  Doing a certain intensity of exercise for a given amount of time. Choose from these options: ? A total of 150 minutes of moderate-intensity exercise every week. ? A total of 75 minutes of vigorous-intensity exercise every week. ? A mix of moderate-intensity and vigorous-intensity exercise every week. Children, pregnant women, people who have not exercised regularly, people who are overweight, and older adults may need to talk with a health care provider about what activities are safe to do. If you have a medical condition, be sure to talk with your health care provider before you start a new exercise program. What are some exercise ideas? Moderate-intensity exercise ideas include:  Walking 1 mile (1.6 km) in about 15  minutes.  Biking.  Hiking.  Golfing.  Dancing.  Water aerobics. Vigorous-intensity exercise ideas include:  Walking 4.5 miles (7.2 km) or more in about 1 hour.  Jogging or running 5 miles (8 km) in about 1 hour.  Biking 10 miles (16.1 km) or more in about 1 hour.  Lap swimming.  Roller-skating or in-line skating.  Cross-country skiing.  Vigorous competitive sports, such as football, basketball, and soccer.  Jumping rope.  Aerobic dancing. What are some everyday activities that can help me to get exercise?  Yard work, such as: ? Pushing a lawn mower. ? Raking and bagging leaves.  Washing your car.  Pushing a stroller.  Shoveling snow.  Gardening.  Washing windows or floors. How can I be more active in my day-to-day activities?  Use stairs instead of an elevator.  Take a walk during your lunch break.  If you drive, park your car farther away from your work or school.  If you take public transportation, get off one stop early and walk the rest of the way.  Stand up or walk around during all of your indoor phone calls.  Get up, stretch, and walk around every 30 minutes throughout the day.  Enjoy exercise with a friend. Support to continue exercising will help you keep a regular routine of activity. What guidelines can I follow while exercising?  Before you start a new exercise program, talk with your health care provider.  Do not exercise so much that you hurt yourself, feel dizzy, or get very short of breath.  Wear comfortable clothes and wear shoes with good support.  Drink plenty of water while you exercise to prevent dehydration or heat stroke.  Work out until your breathing   and your heartbeat get faster. Where to find more information  U.S. Department of Health and Human Services: www.hhs.gov  Centers for Disease Control and Prevention (CDC): www.cdc.gov Summary  Exercising regularly is important. It will improve your overall fitness,  flexibility, and endurance.  Regular exercise also will improve your overall health. It can help you control your weight, reduce stress, and improve your bone density.  Do not exercise so much that you hurt yourself, feel dizzy, or get very short of breath.  Before you start a new exercise program, talk with your health care provider. This information is not intended to replace advice given to you by your health care provider. Make sure you discuss any questions you have with your health care provider. Document Revised: 06/02/2017 Document Reviewed: 05/11/2017 Elsevier Patient Education  2020 Elsevier Inc.  

## 2019-09-19 NOTE — Progress Notes (Signed)
GYNECOLOGY OFFICE VISIT NOTE-WELL WOMAN EXAM  History:   Wendy Dorsey G0P0000 here today for annual exam with repeat pap.   Patient currently has a Kyleena in place and reports she is happy with this method.  Patient reports that her periods have stopped and she has intermittent spotting.  Patient expresses worry about lack of periods and states she completes monthly UPT.   Patient reports that she has had one female and one female partner in the last year.  However, she denies any abnormal vaginal discharge, bleeding, pelvic pain or pain/discomfort during sex.  Patient reports that she does not utilize condoms with her partner and had STD testing last month that was negative.  Wendy. Dorsey does not perform monthly SBE and denies having breast awareness.  However, she does go on to report that she has breast tenderness every month for a few days, which used to coincide with her menses.  Patient denies a family history of breast, uterine, cervical, or ovarian cancer.  Patient reports a history of asthma and requests a refill on her inhaler.  Patient denies recent exacerbation of symptoms.   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  Patient reports that she does not have a primary care provider, but has insurance.  She states she does not exercise, but does feel she has a balanced nutritional intake.  She reports usage of marijuana and smokes about 10 blunts daily, but states she has been cutting back since her last visit with this provider.  Patient reports occasional ETOH usage.  In regards to social issues, Wendy Dorsey reports safety at home and denies DV/A.  She endorses having a good social support system in place.   Past Medical History:  Diagnosis Date  . Eczema   . Murmur, cardiac     Past Surgical History:  Procedure Laterality Date  . KNEE SURGERY    . KNEE SURGERY    . LIPOSUCTION  2017  . MOUTH SURGERY      The  following portions of the patient's history were reviewed and updated as appropriate: allergies, current medications, past family history, past medical history, past social history, past surgical history and problem list.   Health Maintenance: LGSIL in Jan 2020.  No mammogram history.   Review of Systems:  Pertinent items noted in HPI and remainder of comprehensive ROS otherwise negative.    Objective:    Physical Exam BP 93/61   Ht 4\' 11"  (1.499 m)   Wt 127 lb 6.4 oz (57.8 kg)   BMI 25.73 kg/m  Physical Exam Exam conducted with a chaperone present.  Constitutional:      Appearance: Normal appearance. She is normal weight.  HENT:     Head: Normocephalic and atraumatic.  Eyes:     Conjunctiva/sclera: Conjunctivae normal.  Neck:     Thyroid: No thyroid mass, thyromegaly or thyroid tenderness.     Trachea: Trachea normal. No tracheal tenderness.  Cardiovascular:     Rate and Rhythm: Normal rate. Rhythm irregular.     Heart sounds: Normal heart sounds.  Pulmonary:     Effort: Pulmonary effort is normal. No respiratory distress.     Breath sounds: Normal breath sounds.  Chest:     Breasts:        Right: No mass, nipple discharge, skin change or tenderness.        Left: No mass, nipple discharge, skin change or tenderness.  Comments: Nipples pierced bilaterally.  No s/s of infection.  Left nipple with 0.25mm opening.  Abdominal:     General: Abdomen is flat. Bowel sounds are normal.     Palpations: Abdomen is soft.     Comments: Umbilicus pierced x 2. No s/s of infection.  Genitourinary:    Labia:        Right: No rash, tenderness or lesion.        Left: No rash, tenderness or lesion.      Vagina: Vaginal discharge (small amt pinkish discharge c/w spotting) present.     Cervix: No cervical motion tenderness, friability or erythema.     Uterus: Not enlarged and not tender.      Adnexa:        Right: No mass or tenderness.         Left: No mass or tenderness.        Comments:  Pap smear collected with brush and spatula. Blue IUD strings visualized ~1cm in length protruding from os.    Musculoskeletal:     Cervical back: Normal range of motion.  Neurological:     Mental Status: She is alert.      Labs and Imaging No results found for this or any previous visit (from the past 168 hour(s)). No results found.   Assessment & Plan:   24 year old  G0PO Annual Exam Kyleena IUD    1. Encounter for annual routine gynecological examination -Exam performed and findings discussed. -Encouraged to activate Mychart. -Educated on AHA exercise recommendations of 30 minutes of moderate to vigorous activity at least 5x/week. -Educated and encouraged to initiate monthly SBE with increased breast awareness including examination of breast for skin changes, moles, tenderness, etc.  -Congratulations given for reduction of MJ usage, Encouraged cessation.    2. Pap smear for cervical cancer screening -Educated on ASCCP guidelines regarding pap smear evaluation and frequency. -Informed of turnover time and provider/clinic policy on releasing results.  3. Mild intermittent asthma, unspecified whether complicated -Encouraged to obtain PCP for management of Asthma. -List of providers to be given at checkout -Will send in refill for Albuterol   4. IUD (intrauterine device) in place -Informed that IUD strings noted -Plan for removal and reinsertion in 11 months.   5. History of abnormal cervical Pap smear -Pap collected   Routine preventative health maintenance measures emphasized. Please refer to After Visit Summary for other counseling recommendations.   Return in about 1 year (around 09/18/2020) for Annual Visit.      Cherre Robins, CNM 09/19/2019

## 2019-09-19 NOTE — Progress Notes (Signed)
Pt presents for annual and pap.  STD screening negative last month.   Pt requests inhaler refill. Completed HPV gardasil vaccination in high school.

## 2019-09-20 LAB — CYTOLOGY - PAP
Comment: NEGATIVE
Diagnosis: NEGATIVE
High risk HPV: NEGATIVE

## 2020-03-04 ENCOUNTER — Other Ambulatory Visit: Payer: Federal, State, Local not specified - PPO
